# Patient Record
Sex: Male | Born: 1990 | Race: White | Hispanic: No | Marital: Married | Smoking: Current every day smoker
Health system: Southern US, Community
[De-identification: ages and names within clinical notes are randomized; demographics above are authoritative.]

## PROBLEM LIST (undated history)

## (undated) DIAGNOSIS — R569 Unspecified convulsions: Secondary | ICD-10-CM

---
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
## 2013-03-24 IMAGING — CR DG RIBS W/ CHEST 3+V*L*
4 series · 4 of 4 positions shown · non-contrast
Comparison: None.

CLINICAL DATA: Fell from second story.  Injured left ribs.

LEFT RIBS AND CHEST - 3+ VIEW 09/07/2010:

[view not recorded (1 of 4)]
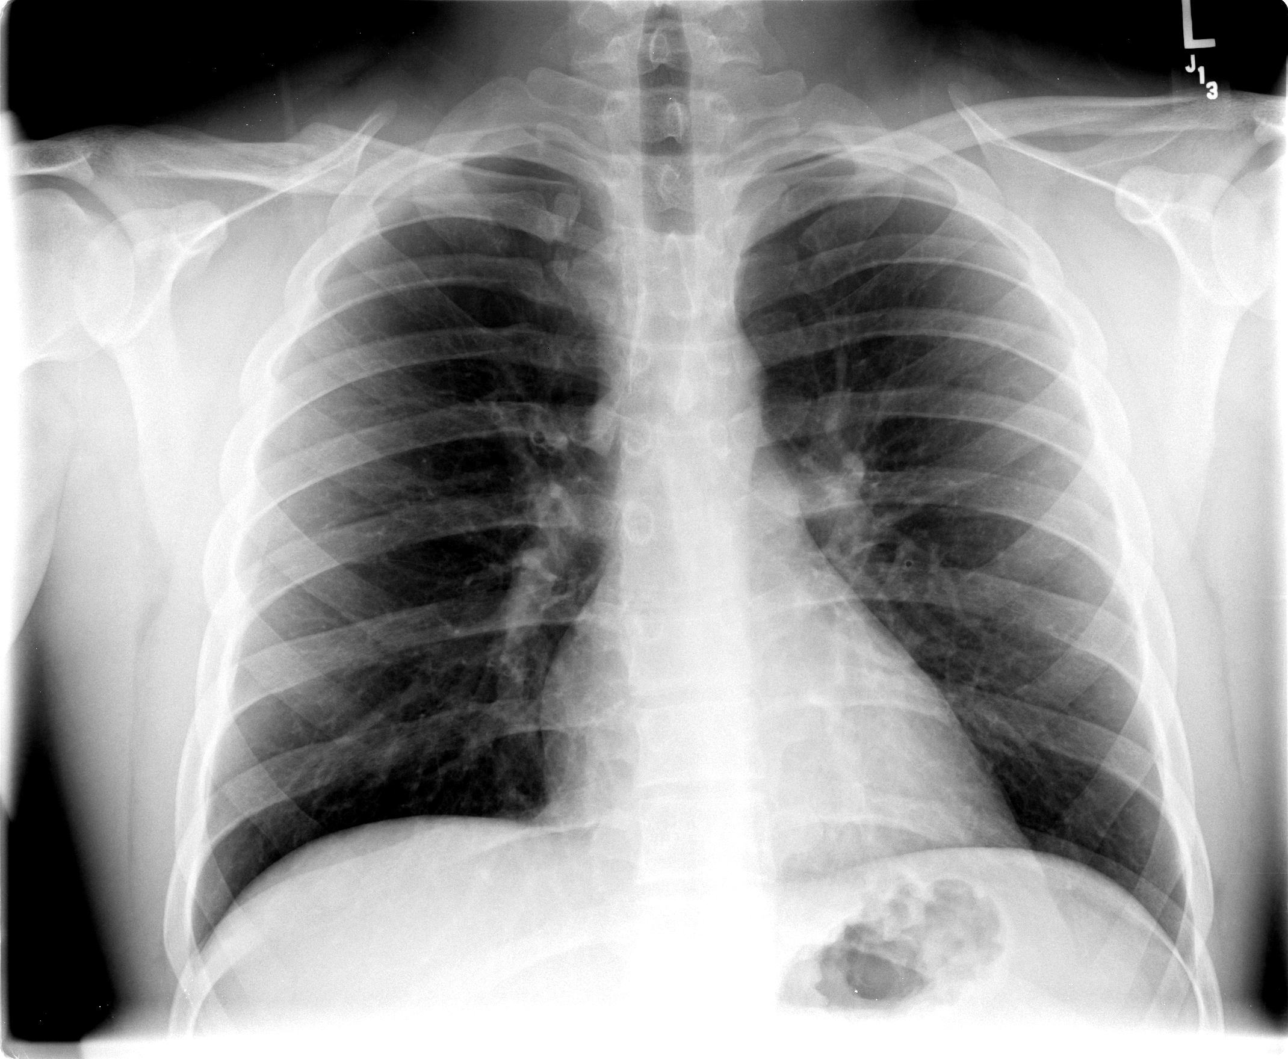

[view not recorded (2 of 4)]
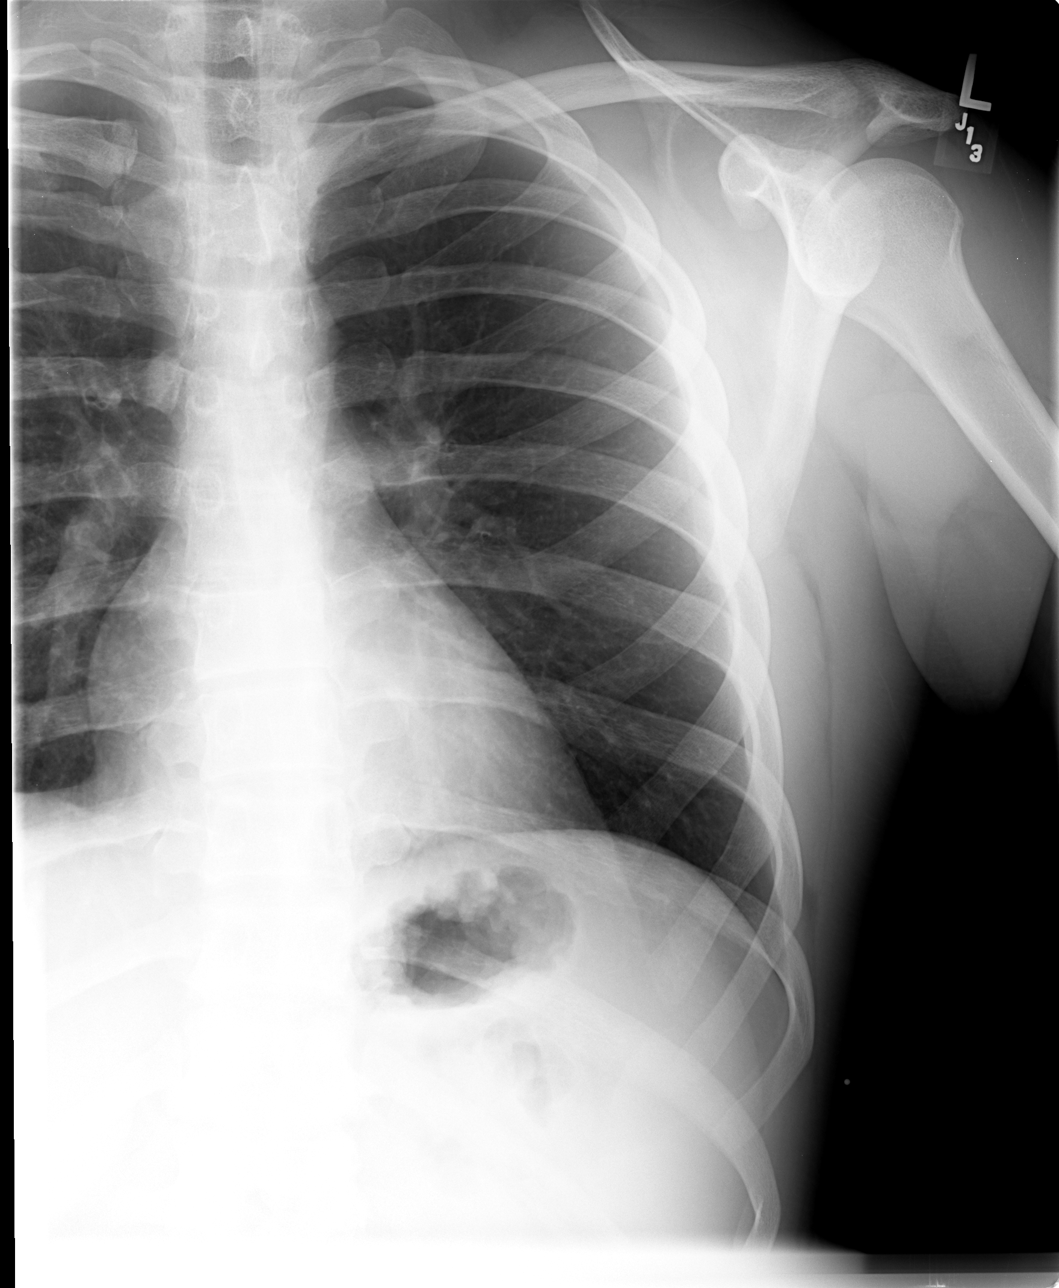

[view not recorded (3 of 4)]
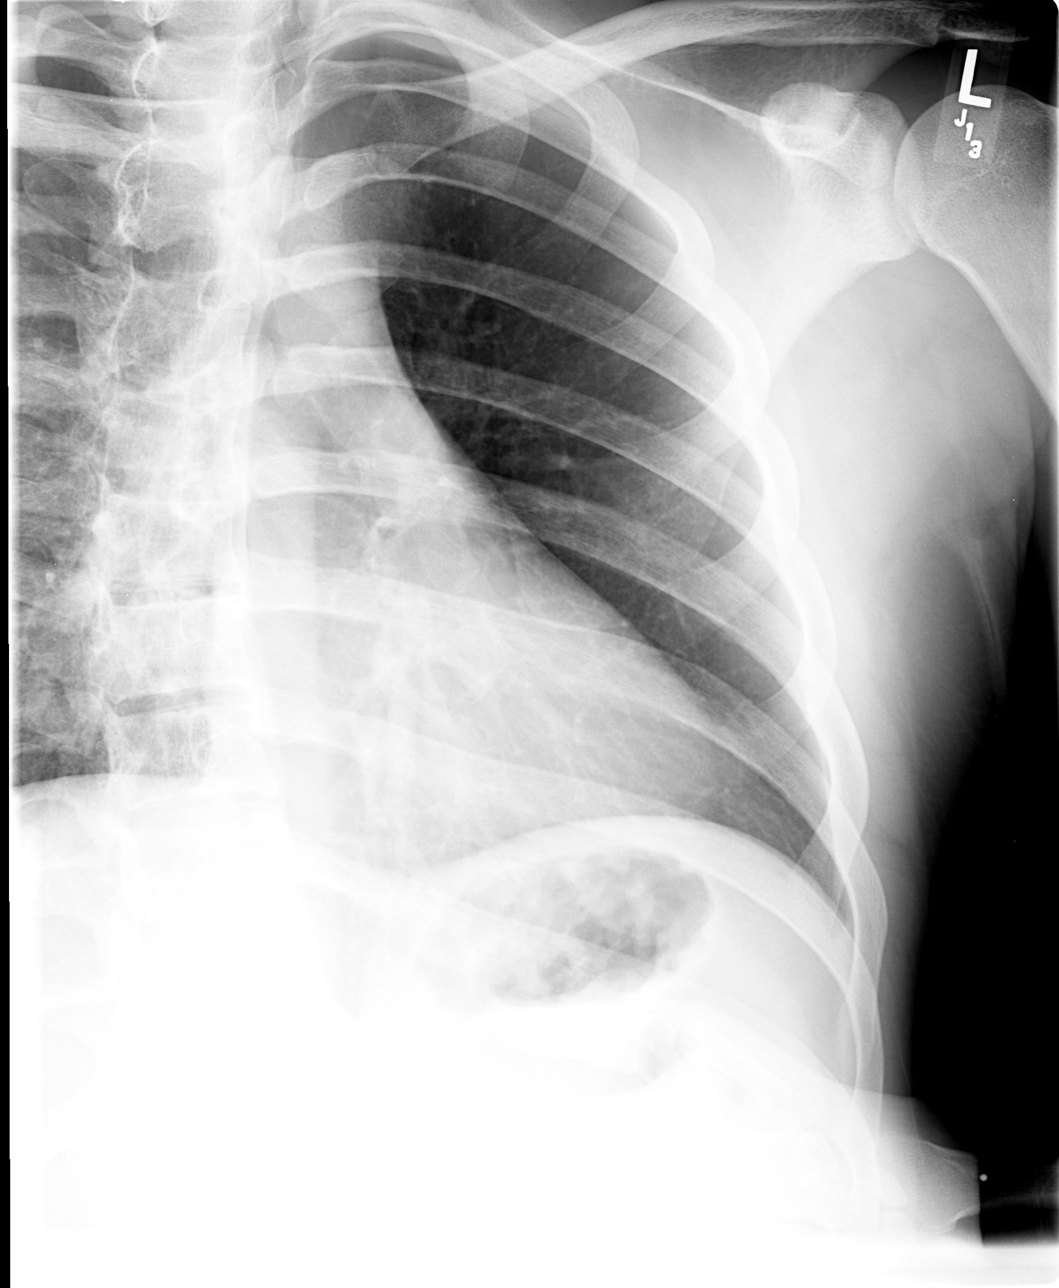

[view not recorded (4 of 4)]
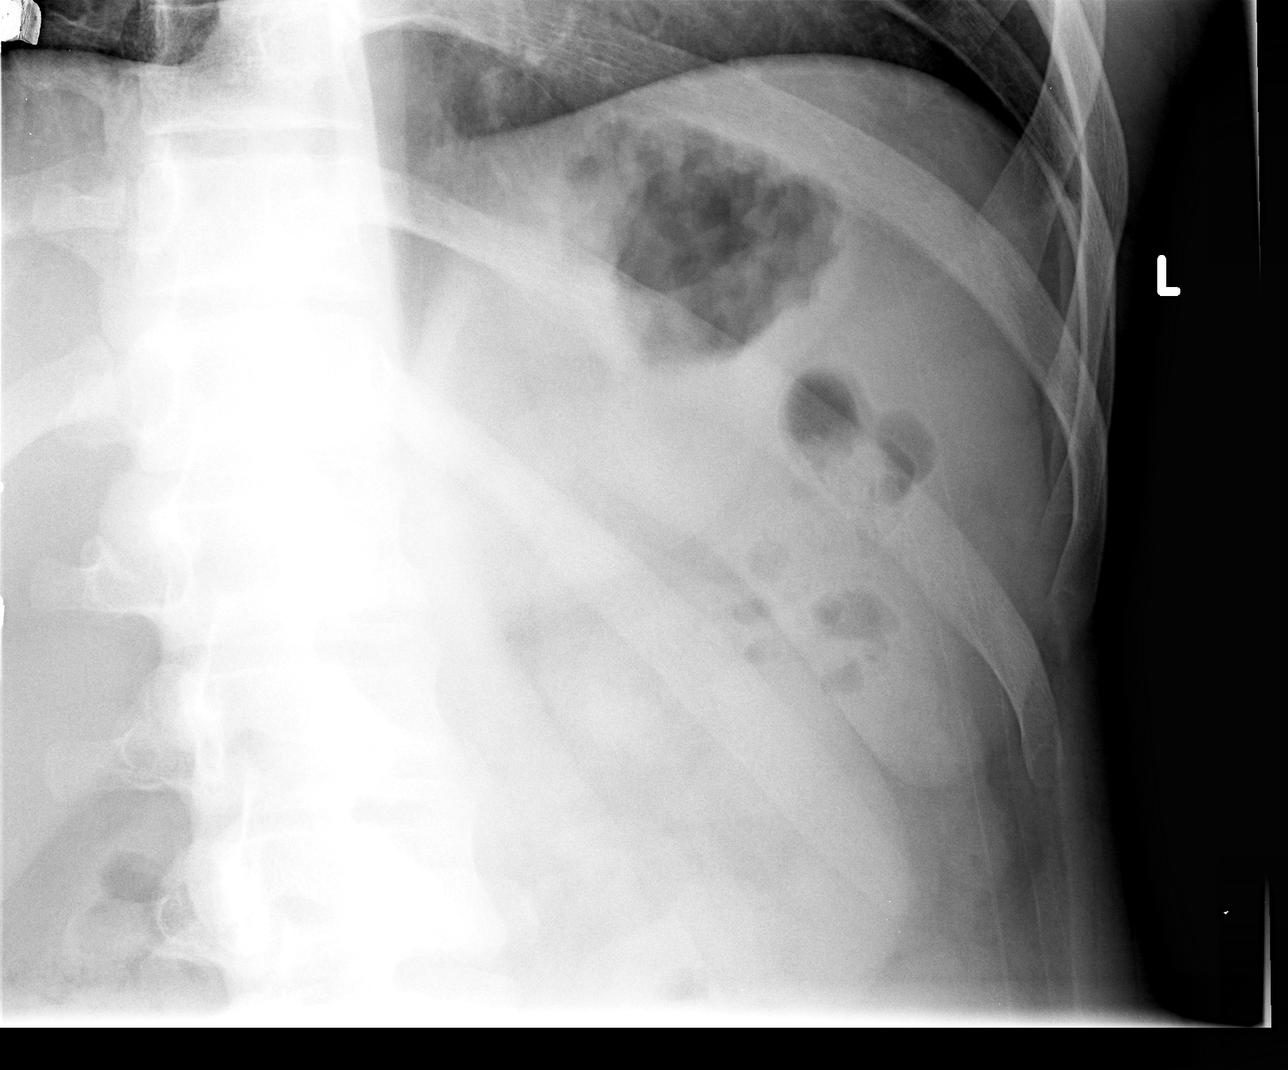

[4 of 4 positions shown; findings below may reference images not displayed]

FINDINGS: Site of maximal pain and tenderness marked with a small
metallic BB.  No underlying left rib fractures.  No intrinsic
osseous abnormalities involving the left ribs.

Cardiomediastinal silhouette unremarkable.  Lungs clear.  No
pleural effusions.  No pneumothorax.
IMPRESSION: 1.  No left rib fracture identified.
2.  No acute cardiopulmonary disease.

## 2014-10-11 ENCOUNTER — Emergency Department
Admission: EM | Admit: 2014-10-11 | Discharge: 2014-10-11 | Disposition: A | Discharge status: Home / Self Care | Payer: Self-pay | Attending: Emergency Medicine | Admitting: Emergency Medicine

## 2014-10-11 ENCOUNTER — Encounter: Payer: Self-pay | Admitting: *Deleted

## 2014-10-11 ENCOUNTER — Emergency Department: Payer: Self-pay

## 2014-10-11 DIAGNOSIS — Y998 Other external cause status: Secondary | ICD-10-CM | POA: Insufficient documentation

## 2014-10-11 DIAGNOSIS — Y9389 Activity, other specified: Secondary | ICD-10-CM | POA: Insufficient documentation

## 2014-10-11 DIAGNOSIS — Z880 Allergy status to penicillin: Secondary | ICD-10-CM | POA: Insufficient documentation

## 2014-10-11 DIAGNOSIS — S60142A Contusion of left ring finger with damage to nail, initial encounter: Secondary | ICD-10-CM | POA: Insufficient documentation

## 2014-10-11 DIAGNOSIS — Y9289 Other specified places as the place of occurrence of the external cause: Secondary | ICD-10-CM | POA: Insufficient documentation

## 2014-10-11 DIAGNOSIS — Z760 Encounter for issue of repeat prescription: Secondary | ICD-10-CM | POA: Insufficient documentation

## 2014-10-11 DIAGNOSIS — Z720 Tobacco use: Secondary | ICD-10-CM | POA: Insufficient documentation

## 2014-10-11 DIAGNOSIS — W230XXA Caught, crushed, jammed, or pinched between moving objects, initial encounter: Secondary | ICD-10-CM | POA: Insufficient documentation

## 2014-10-11 HISTORY — DX: Unspecified convulsions: R56.9

## 2014-10-11 MED ORDER — LIDOCAINE HCL (PF) 1 % IJ SOLN
INTRAMUSCULAR | Status: AC
  Filled 2014-10-11: qty 5

## 2014-10-11 MED ORDER — PHENYTOIN SODIUM EXTENDED 100 MG PO CAPS: 200.0000 mg | ORAL_CAPSULE | Freq: Two times a day (BID) | ORAL | Status: DC

## 2014-10-11 MED ORDER — OXYCODONE-ACETAMINOPHEN 5-325 MG PO TABS
1.0000 | ORAL_TABLET | Freq: Once | ORAL | Status: AC
  Administered 2014-10-11: 1 via ORAL

## 2014-10-11 MED ORDER — BACITRACIN-NEOMYCIN-POLYMYXIN OINTMENT TUBE: TOPICAL_OINTMENT | Freq: Once | CUTANEOUS | Status: DC

## 2014-10-11 MED ORDER — TRAMADOL HCL 50 MG PO TABS: 50.0000 mg | ORAL_TABLET | Freq: Four times a day (QID) | ORAL | Status: DC | PRN

## 2014-10-11 MED ORDER — BACITRACIN ZINC 500 UNIT/GM EX OINT
TOPICAL_OINTMENT | CUTANEOUS | Status: AC
  Filled 2014-10-11: qty 0.9

## 2014-10-11 MED ORDER — OXYCODONE-ACETAMINOPHEN 5-325 MG PO TABS
ORAL_TABLET | ORAL | Status: AC
  Filled 2014-10-11: qty 1

## 2014-10-11 NOTE — Discharge Instructions (Signed)
Contusion °A contusion is a deep bruise. Contusions happen when an injury causes bleeding under the skin. Signs of bruising include pain, puffiness (swelling), and discolored skin. The contusion may turn blue, purple, or yellow. °HOME CARE  °· Put ice on the injured area. °¨ Put ice in a plastic bag. °¨ Place a towel between your skin and the bag. °¨ Leave the ice on for 15-20 minutes, 03-04 times a day. °· Only take medicine as told by your doctor. °· Rest the injured area. °· If possible, raise (elevate) the injured area to lessen puffiness. °GET HELP RIGHT AWAY IF:  °· You have more bruising or puffiness. °· You have pain that is getting worse. °· Your puffiness or pain is not helped by medicine. °MAKE SURE YOU:  °· Understand these instructions. °· Will watch your condition. °· Will get help right away if you are not doing well or get worse. °Document Released: 09/12/2007 Document Revised: 06/18/2011 Document Reviewed: 01/29/2011 °ExitCare® Patient Information ©2015 [employer]. This information is not intended to replace advice given to you by your health care provider. Make sure you discuss any questions you have with your health care provider. ° °

## 2014-10-11 NOTE — ED Provider Notes (Signed)
[facility] Emergency Department Provider Note  ____________________________________________  Time seen: Approximately 4:48 PM  I have reviewed the triage vital signs and the nursing notes.   HISTORY  Chief Complaint Finger Injury    HPI Edward Harper is a 24 y.o. male patient complaining of pain to the fourth digit left hand connected to a crush incident. Patient stated to this finger was crushed between machinery. State he lost distal part of the nail. Denies any loss sensation or loss of motion. Patient rated his pain as a 7/10. Patient is also requesting refill of his Dilantin.Marland Kitchen.   Past Medical History  Diagnosis Date  . Seizures     There are no active problems to display for this patient.   History reviewed. No pertinent past surgical history.  Current Outpatient Rx  Name  Route  Sig  Dispense  Refill  . phenytoin (DILANTIN) 100 MG ER capsule   Oral   Take 200 mg by mouth 2 (two) times daily.         . phenytoin (DILANTIN) 100 MG ER capsule   Oral   Take 2 capsules (200 mg total) by mouth 2 (two) times daily.   60 capsule   0   . traMADol (ULTRAM) 50 MG tablet   Oral   Take 1 tablet (50 mg total) by mouth every 6 (six) hours as needed for moderate pain.   12 tablet   0     Allergies Penicillins  No family history on file.  Social History History  Substance Use Topics  . Smoking status: Current Every Day Smoker  . Smokeless tobacco: Not on file  . Alcohol Use: No    Review of Systems Constitutional: No fever/chills Eyes: No visual changes. ENT: No sore throat. Cardiovascular: Denies chest pain. Respiratory: Denies shortness of breath. Gastrointestinal: No abdominal pain.  No nausea, no vomiting.  No diarrhea.  No constipation. Genitourinary: Negative for dysuria. Musculoskeletal: Pain for digit left hand. Skin: Negative for rash. Neurological: Negative for headaches, focal weakness or numbness.  10-point ROS  otherwise negative.  ____________________________________________   PHYSICAL EXAM:  VITAL SIGNS: ED Triage Vitals  Enc Vitals Group     BP 10/11/14 1508 138/74 mmHg     Pulse Rate 10/11/14 1508 66     Resp --      Temp 10/11/14 1508 98.3 F (36.8 C)     Temp Source 10/11/14 1508 Oral     SpO2 10/11/14 1508 99 %     Weight 10/11/14 1508 175 lb (79.379 kg)     Height 10/11/14 1508 5\' 8"  (1.727 m)     Head Cir --      Peak Flow --      Pain Score --      Pain Loc --      Pain Edu? --      Excl. in GC? --     Constitutional: Alert and oriented. Well appearing and in no acute distress. Eyes: Conjunctivae are normal. PERRL. EOMI. Head: Atraumatic. Nose: No congestion/rhinnorhea. Mouth/Throat: Mucous membranes are moist.  Oropharynx non-erythematous. Neck: No stridor. No cervical spine tenderness to palpation. Hematological/Lymphatic/Immunilogical: No cervical lymphadenopathy. Cardiovascular: Normal rate, regular rhythm. Grossly normal heart sounds.  Good peripheral circulation. Respiratory: Normal respiratory effort.  No retractions. Lungs CTAB. Gastrointestinal: Soft and nontender. No distention. No abdominal bruits. No CVA tenderness. Musculoskeletal: No lower extremity tenderness nor edema.  No joint effusions. Neurologic:  Normal speech and language. No gross focal neurologic  deficits are appreciated. Speech is normal. No gait instability. Skin:  Skin is warm, dry and intact. No rash noted. Psychiatric: Mood and affect are normal. Speech and behavior are normal.  ____________________________________________   LABS (all labs ordered are listed, but only abnormal results are displayed)  Labs Reviewed - No data to display ____________________________________________  EKG   ____________________________________________  RADIOLOGY  No acute findings.  I, Joni Reining, personally viewed and evaluated these images as part of my medical decision making.    ____________________________________________   PROCEDURES  Procedure(s) performed: None  Critical Care performed: No  ____________________________________________   INITIAL IMPRESSION / ASSESSMENT AND PLAN / ED COURSE  Pertinent labs & imaging results that were available during my care of the patient were reviewed by me and considered in my medical decision making (see chart for details).  Partial nail avulsion to the fourth digit left hand secondary to a crush incident. Patient is also requesting refill for Dilantin. Discussed x-ray finding with patient showing no fracture. Patient given instruction on nail injury. Patient advises that one time refill of Dilantin T Konrad Saha refills from his family doctor. Patient also given a prescription for 3 days of tramadol to take as needed for pain. Patient advised to return to clinic if signs of infection develop. ____________________________________________   FINAL CLINICAL IMPRESSION(S) / ED DIAGNOSES  Final diagnoses:  Contusion of left ring finger with damage to nail, initial encounter  Encounter for medication refill      Joni Reining, PA-C 10/11/14 1814  Sharman Cheek, MD 10/11/14 2356

## 2014-10-11 NOTE — ED Notes (Signed)
Also need dilantin  refilled

## 2017-04-27 IMAGING — CR DG FINGER RING 2+V*L*
1 series · 3 of 3 positions shown · non-contrast
Comparison: None.

CLINICAL DATA: Crush injury to the ring finger today. Pain and
bleeding of the distal tuft.

EXAM:
LEFT RING FINGER 2+V

[Series 1: lat · 0.17mm/px · 3 of 3 slices shown]
[im 1/3]
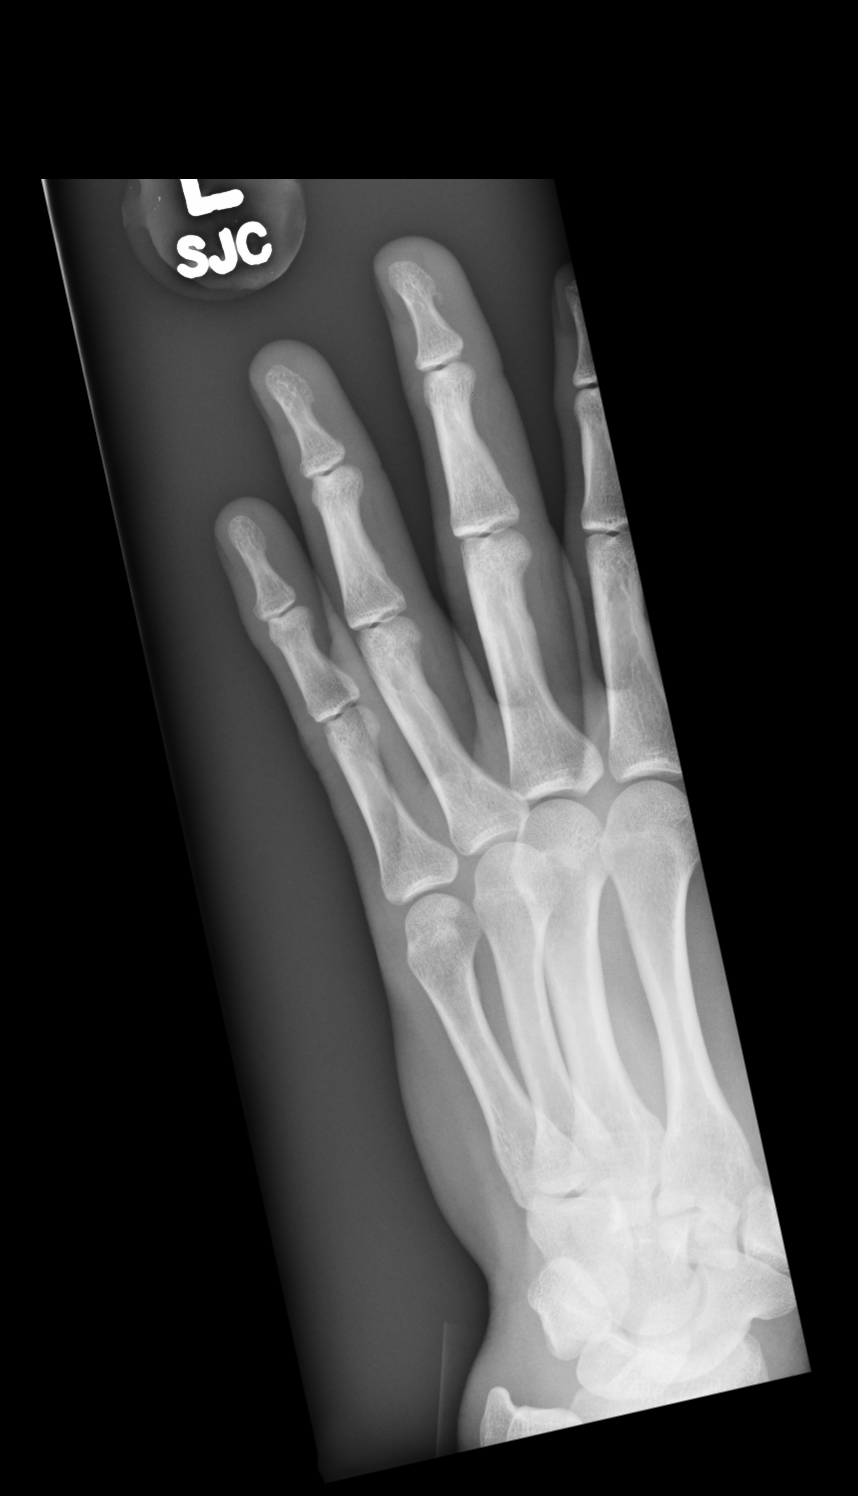
[im 2/3]
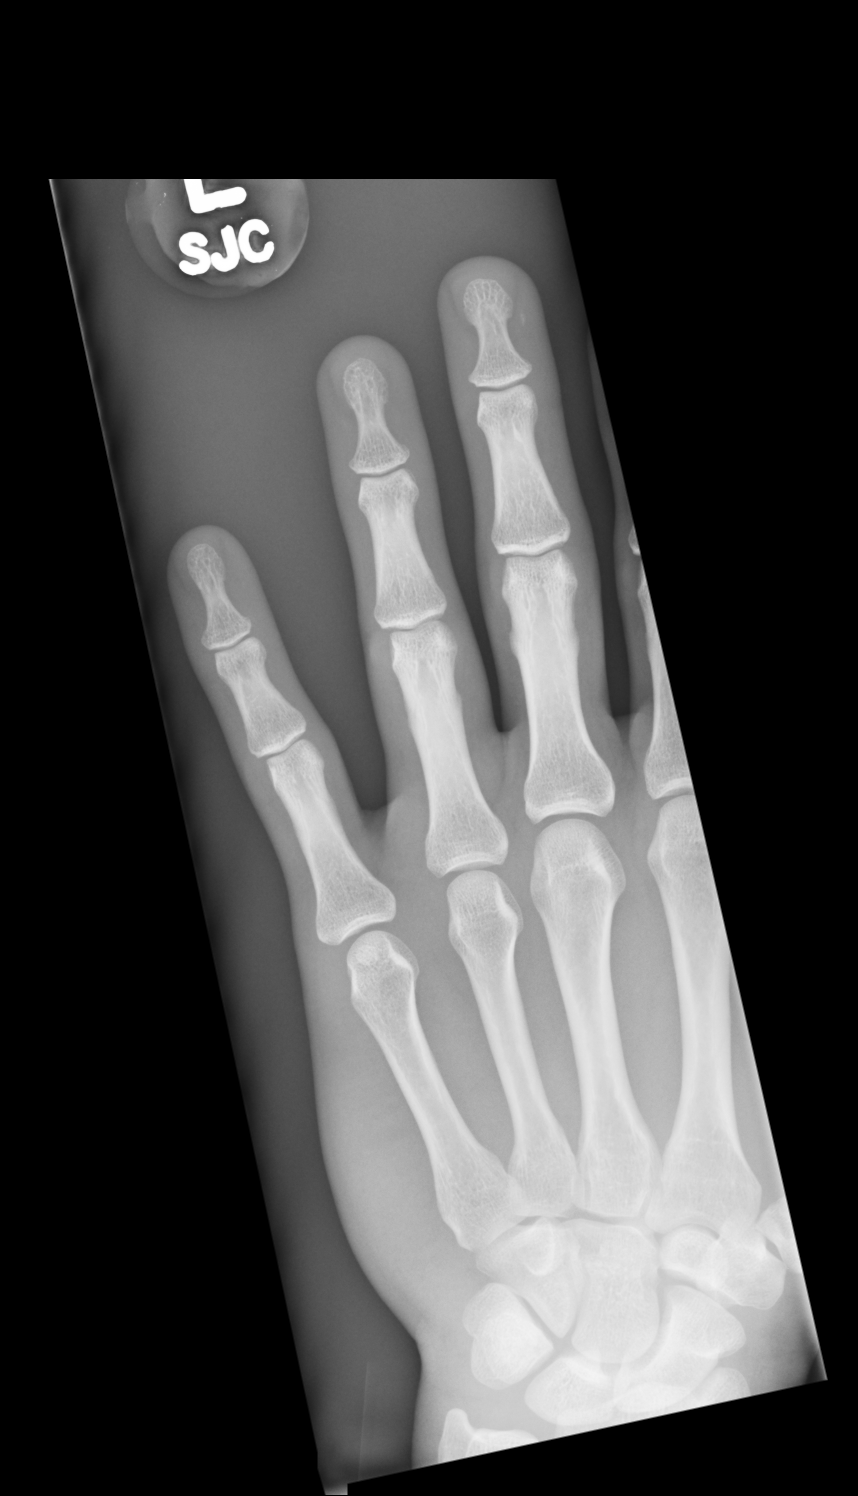
[im 3/3]
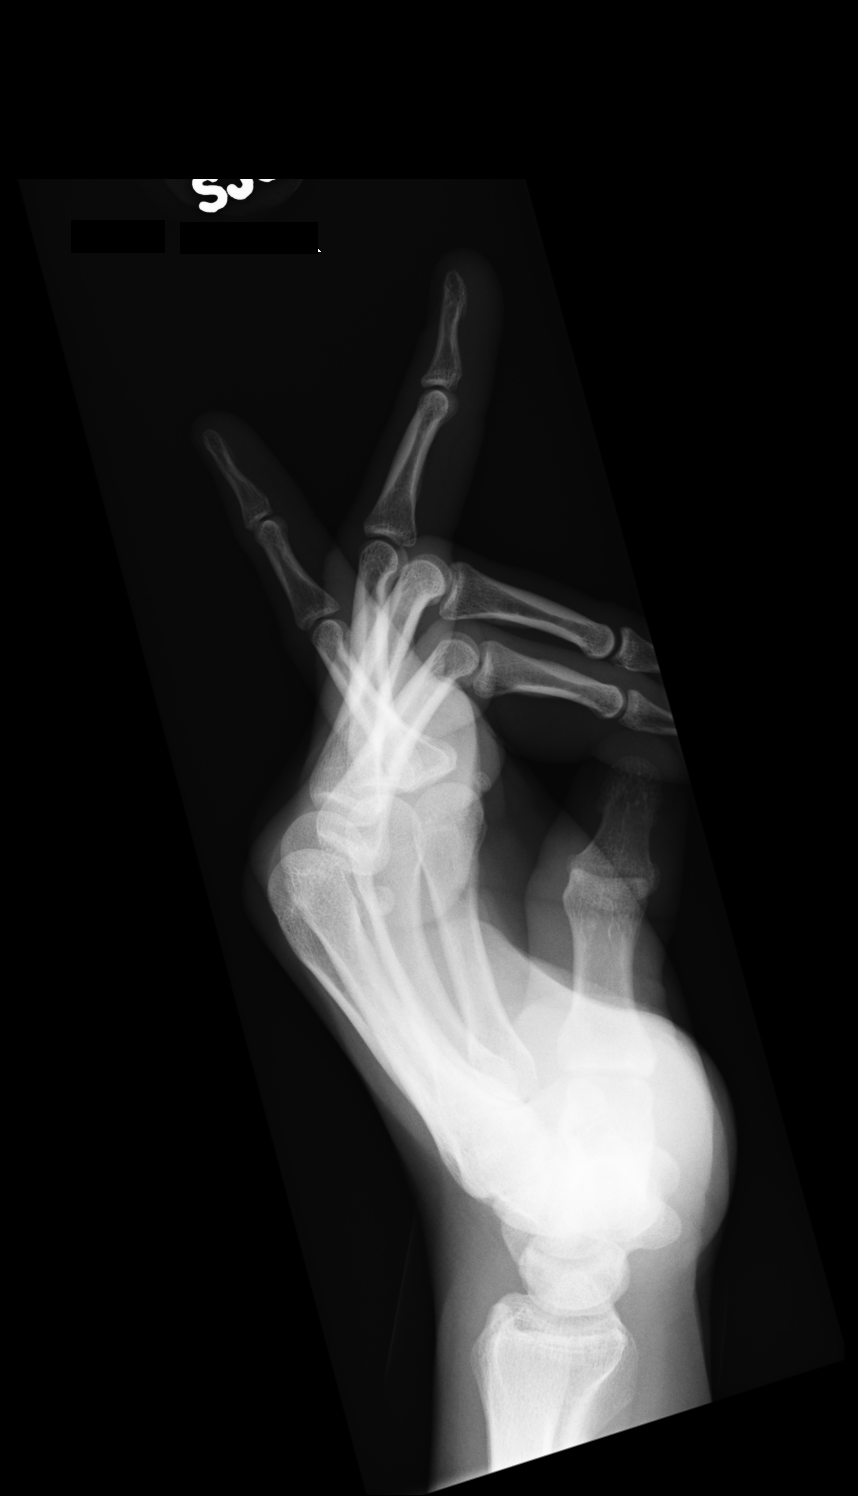

[3 of 3 positions shown; findings below may reference images not displayed]

FINDINGS: No fracture, dislocation or radiopaque foreign object. Small
calcification noted adjacent to the distal tuft of the long finger,
not injured today by history.
IMPRESSION: Normal appearance of the ring finger.

## 2023-02-19 ENCOUNTER — Emergency Department (HOSPITAL_COMMUNITY): Payer: Medicaid Other

## 2023-02-19 ENCOUNTER — Encounter (HOSPITAL_COMMUNITY): Payer: Self-pay

## 2023-02-19 ENCOUNTER — Observation Stay (HOSPITAL_COMMUNITY)
Admission: EM | Admit: 2023-02-19 | Discharge: 2023-02-20 | Disposition: A | Discharge status: Home / Self Care | Payer: Medicaid Other | Attending: General Surgery | Admitting: General Surgery

## 2023-02-19 ENCOUNTER — Other Ambulatory Visit: Payer: Self-pay

## 2023-02-19 DIAGNOSIS — W138XXA Fall from, out of or through other building or structure, initial encounter: Secondary | ICD-10-CM | POA: Diagnosis not present

## 2023-02-19 DIAGNOSIS — W19XXXA Unspecified fall, initial encounter: Principal | ICD-10-CM

## 2023-02-19 DIAGNOSIS — S0990XA Unspecified injury of head, initial encounter: Secondary | ICD-10-CM | POA: Insufficient documentation

## 2023-02-19 DIAGNOSIS — Z79899 Other long term (current) drug therapy: Secondary | ICD-10-CM | POA: Insufficient documentation

## 2023-02-19 DIAGNOSIS — T07XXXA Unspecified multiple injuries, initial encounter: Secondary | ICD-10-CM

## 2023-02-19 DIAGNOSIS — F17200 Nicotine dependence, unspecified, uncomplicated: Secondary | ICD-10-CM | POA: Diagnosis not present

## 2023-02-19 DIAGNOSIS — M25512 Pain in left shoulder: Secondary | ICD-10-CM | POA: Diagnosis present

## 2023-02-19 DIAGNOSIS — S301XXA Contusion of abdominal wall, initial encounter: Secondary | ICD-10-CM | POA: Insufficient documentation

## 2023-02-19 DIAGNOSIS — S27329A Contusion of lung, unspecified, initial encounter: Secondary | ICD-10-CM | POA: Diagnosis present

## 2023-02-19 DIAGNOSIS — S27322A Contusion of lung, bilateral, initial encounter: Secondary | ICD-10-CM | POA: Diagnosis not present

## 2023-02-19 LAB — URINALYSIS, ROUTINE W REFLEX MICROSCOPIC
Bacteria, UA: NONE SEEN
Bilirubin Urine: NEGATIVE
Glucose, UA: NEGATIVE mg/dL
Ketones, ur: NEGATIVE mg/dL
Leukocytes,Ua: NEGATIVE
Nitrite: NEGATIVE
Protein, ur: 100 mg/dL — AB
Specific Gravity, Urine: >1.046 — ABNORMAL HIGH (ref 1.005–1.030)
pH: 5 (ref 5.0–8.0)

## 2023-02-19 LAB — COMPREHENSIVE METABOLIC PANEL
ALT: 38 U/L (ref 0–44)
AST: 45 U/L — ABNORMAL HIGH (ref 15–41)
Albumin: 3.9 g/dL (ref 3.5–5.0)
Alkaline Phosphatase: 60 U/L (ref 38–126)
Anion gap: 9 (ref 5–15)
BUN: 19 mg/dL (ref 6–20)
CO2: 24 mmol/L (ref 22–32)
Calcium: 9.1 mg/dL (ref 8.9–10.3)
Chloride: 102 mmol/L (ref 98–111)
Creatinine, Ser: 1 mg/dL (ref 0.61–1.24)
GFR, Estimated: >60 mL/min (ref >60)
Glucose, Bld: 103 mg/dL — ABNORMAL HIGH (ref 70–99)
Potassium: 4 mmol/L (ref 3.5–5.1)
Sodium: 135 mmol/L (ref 135–145)
Total Bilirubin: 0.6 mg/dL (ref <1.2)
Total Protein: 6.3 g/dL — ABNORMAL LOW (ref 6.5–8.1)

## 2023-02-19 LAB — I-STAT CHEM 8, ED
BUN: 23 mg/dL — ABNORMAL HIGH (ref 6–20)
Calcium, Ion: 1.12 mmol/L — ABNORMAL LOW (ref 1.15–1.40)
Chloride: 102 mmol/L (ref 98–111)
Creatinine, Ser: 1.1 mg/dL (ref 0.61–1.24)
Glucose, Bld: 104 mg/dL — ABNORMAL HIGH (ref 70–99)
HCT: 41 % (ref 39.0–52.0)
Hemoglobin: 13.9 g/dL (ref 13.0–17.0)
Potassium: 3.8 mmol/L (ref 3.5–5.1)
Sodium: 138 mmol/L (ref 135–145)
TCO2: 25 mmol/L (ref 22–32)

## 2023-02-19 LAB — CBC
HCT: 39.6 % (ref 39.0–52.0)
Hemoglobin: 13.8 g/dL (ref 13.0–17.0)
MCH: 30 pg (ref 26.0–34.0)
MCHC: 34.8 g/dL (ref 30.0–36.0)
MCV: 86.1 fL (ref 80.0–100.0)
Platelets: 283 10*3/uL (ref 150–400)
RBC: 4.6 MIL/uL (ref 4.22–5.81)
RDW: 11.7 % (ref 11.5–15.5)
WBC: 17.3 10*3/uL — ABNORMAL HIGH (ref 4.0–10.5)
nRBC: 0 % (ref 0.0–0.2)

## 2023-02-19 LAB — I-STAT CG4 LACTIC ACID, ED: Lactic Acid, Venous: 1.7 mmol/L (ref 0.5–1.9)

## 2023-02-19 LAB — PROTIME-INR
INR: 1 (ref 0.8–1.2)
Prothrombin Time: 13.2 s (ref 11.4–15.2)

## 2023-02-19 LAB — SAMPLE TO BLOOD BANK

## 2023-02-19 LAB — ETHANOL: Alcohol, Ethyl (B): <10 mg/dL (ref <10)

## 2023-02-19 MED ORDER — FENTANYL CITRATE PF 50 MCG/ML IJ SOSY: 100.0000 ug | PREFILLED_SYRINGE | Freq: Once | INTRAMUSCULAR | Status: AC

## 2023-02-19 MED ORDER — OXYCODONE HCL 5 MG PO TABS
5.0000 mg | ORAL_TABLET | ORAL | Status: DC | PRN
Start: 2023-02-19 — End: 2023-02-20
  Administered 2023-02-20: 5 mg via ORAL
  Filled 2023-02-19: qty 1

## 2023-02-19 MED ORDER — ONDANSETRON HCL 4 MG/2ML IJ SOLN: 4.0000 mg | Freq: Four times a day (QID) | INTRAMUSCULAR | Status: DC | PRN

## 2023-02-19 MED ORDER — ENOXAPARIN SODIUM 30 MG/0.3ML IJ SOSY
30.0000 mg | PREFILLED_SYRINGE | Freq: Two times a day (BID) | INTRAMUSCULAR | Status: DC
  Filled 2023-02-19: qty 0.3

## 2023-02-19 MED ORDER — DOCUSATE SODIUM 100 MG PO CAPS
100.0000 mg | ORAL_CAPSULE | Freq: Two times a day (BID) | ORAL | Status: DC
  Administered 2023-02-19 – 2023-02-20 (×2): 100 mg via ORAL
  Filled 2023-02-19 (×2): qty 1

## 2023-02-19 MED ORDER — MELATONIN 3 MG PO TABS
3.0000 mg | ORAL_TABLET | Freq: Every evening | ORAL | Status: DC | PRN
  Administered 2023-02-20: 3 mg via ORAL
  Filled 2023-02-19: qty 1

## 2023-02-19 MED ORDER — METHOCARBAMOL 500 MG PO TABS
500.0000 mg | ORAL_TABLET | Freq: Three times a day (TID) | ORAL | Status: DC
  Administered 2023-02-19 – 2023-02-20 (×2): 500 mg via ORAL
  Filled 2023-02-19 (×2): qty 1

## 2023-02-19 MED ORDER — POLYETHYLENE GLYCOL 3350 17 G PO PACK: 17.0000 g | PACK | Freq: Every day | ORAL | Status: DC | PRN

## 2023-02-19 MED ORDER — LEVETIRACETAM IN NACL 1000 MG/100ML IV SOLN
1000.0000 mg | Freq: Once | INTRAVENOUS | Status: AC
  Administered 2023-02-19: 1000 mg via INTRAVENOUS
  Filled 2023-02-19: qty 100

## 2023-02-19 MED ORDER — ACETAMINOPHEN 500 MG PO TABS
1000.0000 mg | ORAL_TABLET | Freq: Four times a day (QID) | ORAL | Status: DC
  Administered 2023-02-20: 1000 mg via ORAL
  Filled 2023-02-19 (×2): qty 2

## 2023-02-19 MED ORDER — IOPAMIDOL (ISOVUE-370) INJECTION 76%
75.0000 mL | Freq: Once | INTRAVENOUS | Status: AC | PRN
  Administered 2023-02-19: 75 mL via INTRAVENOUS

## 2023-02-19 MED ORDER — HYDROMORPHONE HCL 1 MG/ML IJ SOLN
0.5000 mg | INTRAMUSCULAR | Status: DC | PRN
  Administered 2023-02-19 – 2023-02-20 (×4): 0.5 mg via INTRAVENOUS
  Filled 2023-02-19: qty 1
  Filled 2023-02-19 (×3): qty 0.5

## 2023-02-19 MED ORDER — HYDRALAZINE HCL 20 MG/ML IJ SOLN: 10.0000 mg | INTRAMUSCULAR | Status: DC | PRN

## 2023-02-19 MED ORDER — GABAPENTIN 300 MG PO CAPS
300.0000 mg | ORAL_CAPSULE | Freq: Three times a day (TID) | ORAL | Status: DC
  Administered 2023-02-19 – 2023-02-20 (×2): 300 mg via ORAL
  Filled 2023-02-19 (×2): qty 1

## 2023-02-19 MED ORDER — ONDANSETRON 4 MG PO TBDP: 4.0000 mg | ORAL_TABLET | Freq: Four times a day (QID) | ORAL | Status: DC | PRN

## 2023-02-19 MED ORDER — OXYCODONE HCL 5 MG PO TABS
5.0000 mg | ORAL_TABLET | Freq: Once | ORAL | Status: AC
  Administered 2023-02-19: 5 mg via ORAL
  Filled 2023-02-19: qty 1

## 2023-02-19 MED ORDER — METOPROLOL TARTRATE 5 MG/5ML IV SOLN: 5.0000 mg | Freq: Four times a day (QID) | INTRAVENOUS | Status: DC | PRN

## 2023-02-19 MED ORDER — FENTANYL CITRATE PF 50 MCG/ML IJ SOSY
PREFILLED_SYRINGE | INTRAMUSCULAR | Status: AC
  Administered 2023-02-19: 100 ug via INTRAVENOUS
  Filled 2023-02-19: qty 2

## 2023-02-19 MED ORDER — FENTANYL CITRATE PF 50 MCG/ML IJ SOSY
200.0000 ug | PREFILLED_SYRINGE | Freq: Once | INTRAMUSCULAR | Status: AC
  Administered 2023-02-19: 200 ug via INTRAVENOUS
  Filled 2023-02-19: qty 4

## 2023-02-19 MED ORDER — KETOROLAC TROMETHAMINE 15 MG/ML IJ SOLN
15.0000 mg | Freq: Four times a day (QID) | INTRAMUSCULAR | Status: DC | PRN
Start: 2023-02-19 — End: 2023-02-24

## 2023-02-19 MED ORDER — METHOCARBAMOL 1000 MG/10ML IJ SOLN
500.0000 mg | Freq: Three times a day (TID) | INTRAMUSCULAR | Status: DC
Start: 2023-02-19 — End: 2023-02-20
  Filled 2023-02-19: qty 10

## 2023-02-19 NOTE — H&P (Addendum)
Surgical Evaluation  Chief Complaint: Fall  HPI: 32 year old man with history of seizure disorder (has not taken Keppra in about 2 days), and substance abuse currently on Suboxone who was up in a stand earlier today when he felt like he might be having a seizure, tried to make his way down and ended up falling about 20 feet.  Unknown downtime after this, he woke up crawling around the field, was found by his family who then called EMS.  He was activated as a level 2 trauma after arrival.  He reports pain in the left shoulder and chest, bruising to the right flank.  He has undergone plain films of the chest and pelvis as well as CT scans of the head, C-spine, and chest abdomen pelvis.  This is notable for a right first costochondral junction fracture as well as likely evolving right pulmonary contusions, soft tissue edema along the left hip and left posterior back.  Per Dr. Andria Meuse, bedside ultrasound with concern for pulmonary edema.  Trauma service admission requested regarding pulmonary contusions.  Allergies  Allergen Reactions   Penicillins Rash    Past Medical History:  Diagnosis Date   Seizures (HCC)     History reviewed. No pertinent surgical history.  History reviewed. No pertinent family history.  Social History   Socioeconomic History   Marital status: Single    Spouse name: Not on file   Number of children: Not on file   Years of education: Not on file   Highest education level: Not on file  Occupational History   Not on file  Tobacco Use   Smoking status: Every Day   Smokeless tobacco: Not on file  Substance and Sexual Activity   Alcohol use: No   Drug use: Not on file   Sexual activity: Not Currently  Other Topics Concern   Not on file  Social History Narrative   Not on file   Social Determinants of Health   Financial Resource Strain: Not on file  Food Insecurity: Not on file  Transportation Needs: Not on file  Physical Activity: Not on file  Stress: Not  on file  Social Connections: Not on file    No current facility-administered medications on file prior to encounter.   Current Outpatient Medications on File Prior to Encounter  Medication Sig Dispense Refill   phenytoin (DILANTIN) 100 MG ER capsule Take 200 mg by mouth 2 (two) times daily.     phenytoin (DILANTIN) 100 MG ER capsule Take 2 capsules (200 mg total) by mouth 2 (two) times daily. 60 capsule 0   traMADol (ULTRAM) 50 MG tablet Take 1 tablet (50 mg total) by mouth every 6 (six) hours as needed for moderate pain. 12 tablet 0    Review of Systems: a complete, 10pt review of systems was completed with pertinent positives and negatives as documented in the HPI  Physical Exam: Vitals:   02/19/23 1944 02/19/23 1945  BP:  132/80  Pulse:  87  Resp:  14  Temp: 98.8 F (37.1 C) 98.8 F (37.1 C)  SpO2:  99%   Gen: A&Ox3, no distress  Eyes: lids and conjunctivae normal, no icterus. Pupils equally round and reactive to light.  Neck: supple without mass or thyromegaly.  Trachea midline, no crepitus or hematoma.  No C-spine tenderness. Chest: respiratory effort is normal.  Cardiovascular: RRR with palpable distal pulses, no pedal edema Gastrointestinal: soft, nondistended, nontender. No mass, hepatomegaly or splenomegaly. No hernia. Muscoloskeletal: no clubbing or cyanosis of the  fingers.  Strength is symmetrical throughout.  Range of motion of bilateral upper and lower extremities normal without pain, crepitation or contracture. Neuro: cranial nerves grossly intact.  Sensation intact to light touch diffusely. Psych: appropriate mood and affect, normal insight/judgment intact  Skin: warm and dry; bruising along the right flank      Latest Ref Rng & Units 02/19/2023    7:47 PM 09/07/2010   11:29 AM 10/06/2009    3:13 AM  CBC  WBC 4.0 - 10.5 K/uL  7.7    Hemoglobin 13.0 - 17.0 g/dL 16.1  09.6  04.5   Hematocrit 39.0 - 52.0 % 41.0  45.2  53.0   Platelets 150 - 400 K/uL  233          Latest Ref Rng & Units 02/19/2023    7:47 PM 02/19/2023    7:28 PM 09/07/2010   11:29 AM  CMP  Glucose 70 - 99 mg/dL 409  811  86   BUN 6 - 20 mg/dL 23  19  15    Creatinine 0.61 - 1.24 mg/dL 9.14  7.82  9.56   Sodium 135 - 145 mmol/L 138  135  136   Potassium 3.5 - 5.1 mmol/L 3.8  4.0  4.7   Chloride 98 - 111 mmol/L 102  102  103   CO2 22 - 32 mmol/L  24  28   Calcium 8.9 - 10.3 mg/dL  9.1  21.3   Total Protein 6.5 - 8.1 g/dL  6.3    Total Bilirubin <1.2 mg/dL  0.6    Alkaline Phos 38 - 126 U/L  60    AST 15 - 41 U/L  45    ALT 0 - 44 U/L  38      Lab Results  Component Value Date   INR 1.0 02/19/2023    Imaging: CT HEAD WO CONTRAST  Result Date: 02/19/2023 CLINICAL DATA:  Polytrauma, blunt; Head trauma, moderate-severe pt had a 90ft fall from a tree EXAM: CT HEAD WITHOUT CONTRAST CT CERVICAL SPINE WITHOUT CONTRAST TECHNIQUE: Multidetector CT imaging of the head and cervical spine was performed following the standard protocol without intravenous contrast. Multiplanar CT image reconstructions of the cervical spine were also generated. RADIATION DOSE REDUCTION: This exam was performed according to the departmental dose-optimization program which includes automated exposure control, adjustment of the mA and/or kV according to patient size and/or use of iterative reconstruction technique. COMPARISON:  None Available. FINDINGS: CT HEAD FINDINGS Brain: No evidence of large-territorial acute infarction. No parenchymal hemorrhage. No mass lesion. No extra-axial collection. No mass effect or midline shift. No hydrocephalus. Basilar cisterns are patent. Vascular: No hyperdense vessel. Skull: No acute fracture or focal lesion. Sinuses/Orbits: Paranasal sinuses and mastoid air cells are clear. The orbits are unremarkable. Other: None. CT CERVICAL SPINE FINDINGS Alignment: Normal. Skull base and vertebrae: No acute fracture. No aggressive appearing focal osseous lesion or focal pathologic  process. Soft tissues and spinal canal: No prevertebral fluid or swelling. No visible canal hematoma. Upper chest: Unremarkable. Other: None. IMPRESSION: 1. No acute intracranial abnormality. 2. No acute displaced fracture or traumatic listhesis of the cervical spine. Electronically Signed   By: Tish Frederickson M.D.   On: 02/19/2023 20:40   CT CERVICAL SPINE WO CONTRAST  Result Date: 02/19/2023 CLINICAL DATA:  Polytrauma, blunt; Head trauma, moderate-severe pt had a 40ft fall from a tree EXAM: CT HEAD WITHOUT CONTRAST CT CERVICAL SPINE WITHOUT CONTRAST TECHNIQUE: Multidetector CT imaging of the head and cervical  spine was performed following the standard protocol without intravenous contrast. Multiplanar CT image reconstructions of the cervical spine were also generated. RADIATION DOSE REDUCTION: This exam was performed according to the departmental dose-optimization program which includes automated exposure control, adjustment of the mA and/or kV according to patient size and/or use of iterative reconstruction technique. COMPARISON:  None Available. FINDINGS: CT HEAD FINDINGS Brain: No evidence of large-territorial acute infarction. No parenchymal hemorrhage. No mass lesion. No extra-axial collection. No mass effect or midline shift. No hydrocephalus. Basilar cisterns are patent. Vascular: No hyperdense vessel. Skull: No acute fracture or focal lesion. Sinuses/Orbits: Paranasal sinuses and mastoid air cells are clear. The orbits are unremarkable. Other: None. CT CERVICAL SPINE FINDINGS Alignment: Normal. Skull base and vertebrae: No acute fracture. No aggressive appearing focal osseous lesion or focal pathologic process. Soft tissues and spinal canal: No prevertebral fluid or swelling. No visible canal hematoma. Upper chest: Unremarkable. Other: None. IMPRESSION: 1. No acute intracranial abnormality. 2. No acute displaced fracture or traumatic listhesis of the cervical spine. Electronically Signed   By:  Tish Frederickson M.D.   On: 02/19/2023 20:40   DG Pelvis Portable  Result Date: 02/19/2023 CLINICAL DATA:  Trauma EXAM: PORTABLE PELVIS 1-2 VIEWS COMPARISON:  CT abdomen pelvis 02/19/2023 FINDINGS: There is no evidence of pelvic fracture or diastasis. No pelvic bone lesions are seen. IMPRESSION: Negative. Electronically Signed   By: Tish Frederickson M.D.   On: 02/19/2023 20:34   DG Chest Port 1 View  Result Date: 02/19/2023 CLINICAL DATA:  Trauma EXAM: PORTABLE CHEST 1 VIEW COMPARISON:  CT chest 02/19/2023 FINDINGS: The heart and mediastinal contours are within normal limits. Patchy airspace opacities better evaluated on CT. No pulmonary edema. No pleural effusion. No pneumothorax. No acute osseous abnormality. IMPRESSION: Patchy airspace opacities better evaluated on CT. Electronically Signed   By: Tish Frederickson M.D.   On: 02/19/2023 20:32   CT CHEST ABDOMEN PELVIS W CONTRAST  Result Date: 02/19/2023 CLINICAL DATA:  Polytrauma, blunt  a 72ft fall from a tree EXAM: CT CHEST, ABDOMEN, AND PELVIS WITH CONTRAST TECHNIQUE: Multidetector CT imaging of the chest, abdomen and pelvis was performed following the standard protocol during bolus administration of intravenous contrast. RADIATION DOSE REDUCTION: This exam was performed according to the departmental dose-optimization program which includes automated exposure control, adjustment of the mA and/or kV according to patient size and/or use of iterative reconstruction technique. CONTRAST:  75mL ISOVUE-370 IOPAMIDOL (ISOVUE-370) INJECTION 76% COMPARISON:  None Available. FINDINGS: CHEST: Cardiovascular: No aortic injury. The thoracic aorta is normal in caliber. The heart is normal in size. No significant pericardial effusion. Mediastinum/Nodes: Likely trace residual thymus within the anterior mediastinum. No pneumomediastinum. No mediastinal hematoma. The esophagus is unremarkable. The thyroid is unremarkable. The central airways are patent. No mediastinal,  hilar, or axillary lymphadenopathy. Lungs/Pleura: Peripheral ground-glass airspace opacities within the right lower, bilateral upper lobe suggestive of developing pulmonary contusions. No pulmonary nodule. No pulmonary mass. No pulmonary laceration. No pneumatocele formation. No pleural effusion. No pneumothorax. No hemothorax. Musculoskeletal/Chest wall: No chest wall mass. Left posterior back subcutaneus soft tissue edema versus developing hematoma. No acute sternal fracture. Question anterior right first costochondral junction fracture (6:63). No spinal fracture. 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 ABDOMEN / PELVIS: Hepatobiliary: Not enlarged. No focal lesion. No laceration or subcapsular hematoma. The gallbladder is otherwise unremarkable with no radio-opaque gallstones. No biliary ductal dilatation. Pancreas: Normal pancreatic contour. No main pancreatic duct dilatation. Spleen: Not enlarged. No focal lesion. No laceration, subcapsular hematoma, or  vascular injury. Adrenals/Urinary Tract: No nodularity bilaterally. Bilateral kidneys enhance symmetrically. No hydronephrosis. No contusion, laceration, or subcapsular hematoma. No injury to the vascular structures or collecting systems. No hydroureter. The urinary bladder is unremarkable. On delayed imaging, there is no urothelial wall thickening and there are no filling defects in the opacified portions of the bilateral collecting systems or ureters. Stomach/Bowel: No small or large bowel wall thickening or dilatation. The appendix is unremarkable. Vasculature/Lymphatics: No abdominal aorta or iliac aneurysm. No active contrast extravasation or pseudoaneurysm. No abdominal, pelvic, inguinal lymphadenopathy. Reproductive: Normal. Other: No simple free fluid ascites. No pneumoperitoneum. No hemoperitoneum. No mesenteric hematoma identified. No organized fluid collection. Musculoskeletal: Left hip subcutaneus soft tissue hematoma along the fascia lata with no  definite Norma Fredrickson lesion identified. No acute pelvic fracture. No spinal fracture. Ports and Devices: None. IMPRESSION: 1. Question anterior right first costochondral junction fracture. Correlate with point tenderness to palpation. 2. Peripheral ground-glass airspace opacities within the right lower, bilateral upper lobe suggestive of developing pulmonary contusions. 3. Left hip subcutaneus soft tissue edema. No definite Norma Fredrickson lesion . Recommend attention on follow-up. No underlying pelvic fracture. 4. Left posterior back subcutaneus soft tissue edema versus developing hematoma. 5. No acute intra-abdominal or intrapelvic traumatic injury. 6. No acute fracture or traumatic malalignment of the thoracic or lumbar spine. Electronically Signed   By: Tish Frederickson M.D.   On: 02/19/2023 20:32     A/P: 32 year old male with seizure disorder who presents with possible right 1st rib costochondral junction fracture and pulmonary contusions after a 20 foot fall and seizure. C/o left hip pain- no significant injury on CT- and left shoulder pain- plain films final read pending but no obvious injury.   Will admit to the trauma service for pulmonary toilet, pain control, repeat chest x-ray in the morning.  Seizure disorder- resume home meds once verified by pharmacy  Suboxone - verify, somewhat hyperalgesic; continue PRN pain control for now    Phylliss Blakes, MD [facility]  See AMION to contact appropriate on-call provider

## 2023-02-19 NOTE — ED Triage Notes (Signed)
Pt BIB [facility] pt had a 29ft fall from a tree after having an episode of seizure, unknown seizure time. Pt called wife and family brought pt to EMS. Per EMS, pt has a left shoulder deformity, bruising to his right side.  Gave 200 mcg of Fentanyl en route last dose of 50 mcg given at 1915.

## 2023-02-19 NOTE — TOC CAGE-AID Note (Signed)
Transition of Care [facility]) - CAGE-AID Screening   Patient Details  Name: Edward Harper MRN: [id number] Date of Birth: February 21, 1991  Transition of Care (TOC) CM/SW Contact:    Katha Hamming, RN Phone Number: 02/19/2023, 8:45 PM     CAGE-AID Screening:    Have You Ever Felt You Ought to Cut Down on Your Drinking or Drug Use?: No Have People Annoyed You By Critizing Your Drinking Or Drug Use?: No Have You Felt Bad Or Guilty About Your Drinking Or Drug Use?: No Have You Ever Had a Drink or Used Drugs First Thing In The Morning to Steady Your Nerves or to Get Rid of a Hangover?: No CAGE-AID Score: 0  Substance Abuse Education Offered: No      On suboxone, denies drug/alcohol use

## 2023-02-19 NOTE — ED Notes (Addendum)
Trauma Response Nurse Documentation   Edward Harper is a 32 y.o. male arriving to [facility] ED via EMS  On No antithrombotic. Trauma was activated as a level 2 by ED charge RN based on the following trauma criteria Falls > 20 ft. with adults, >10 ft. with children (<15).  Patient cleared for CT by Dr. Andria Meuse EDP. Pt transported to CT with trauma response nurse present to monitor. RN remained with the patient throughout their absence from the department for clinical observation.   GCS 15.  History   Past Medical History:  Diagnosis Date   Seizures (HCC)      History reviewed. No pertinent surgical history.  Substance abuse - on suboxone currently   Initial Focused Assessment (If applicable, or please see trauma documentation): Alert/oriented male presents via EMS after a fall from tree stand approx 20 feet. Had a seizure and woke up crawling around in a field. Left shoulder pain/chest pain, bruising to right flank.  Airway patent/unobstructed, BS clear No obvious uncontrolled bleeding GCS 15 PERRLA 2  CT's Completed:   CT Head, CT C-Spine, CT Chest w/ contrast, and CT abdomen/pelvis w/ contrast   Interventions:  IV start and trauma lab draw  Portable chest and pelvis XRAY CT head, c-spine, chest/abd/pelvis Keppra IV bolus Fentanyl for pain control Family presence CAGE AID  Plan for disposition:  Admit anticipated  Consults completed:  Trauma Connor paged at 2044, responded at 2048  Event Summary: Presents via EMS for approx 20 foot fall. Reports he had a seizure while trying to climb down from tree stand. Woke up in a field with no recollection of injury. Non compliance with seizure meds, hasn't had them in two days. Left shoulder/chest pain, fright flank bruising. Anticipate admit.  MTP Summary (If applicable): NA  Bedside handoff with ED RN Edward Harper.    Edward Harper O Edward Harper  Trauma Response RN  Please call TRN at [phone] for further assistance.

## 2023-02-19 NOTE — ED Provider Notes (Signed)
[place] EMERGENCY DEPARTMENT AT [facility] Provider Note  CSN: [id number] Arrival date & time: 02/19/23 7846  Chief Complaint(s) Fall  HPI Edward Harper is a 32 y.o. male here today after he fell out of a deer stand approximately 20 feet.  Patient has a seizure disorder, says that he has not taken his Keppra in the last 2 days.  He says that he felt like he was beginning to have a seizure, and tried to get down from the stand, and woke up on the ground.  His family found him.  Patient was not initially activated as a trauma.   Past Medical History Past Medical History:  Diagnosis Date   Seizures [facility])    Patient Active Problem List   Diagnosis Date Noted   Pulmonary contusion 02/19/2023   Home Medication(s) Prior to Admission medications   Medication Sig Start Date End Date Taking? Authorizing Provider  levETIRAcetam (KEPPRA) 1000 MG tablet Take 1,000 mg by mouth 2 (two) times daily. 01/07/23  Yes [provider]  SUBOXONE 8-2 MG FILM Place 1 strip under the tongue 3 (three) times daily. 02/05/23  Yes [provider]                                                                                                                                    Past Surgical History History reviewed. No pertinent surgical history. Family History History reviewed. No pertinent family history.  Social History Social History   Tobacco Use   Smoking status: Every Day  Substance Use Topics   Alcohol use: No   Allergies Penicillins  Review of Systems Review of Systems  Physical Exam Vital Signs  I have reviewed the triage vital signs BP 132/80   Pulse 87   Temp 98.8 F (37.1 C)   Resp 14   Ht 5\' 8"  (1.727 m)   Wt 99.8 kg   SpO2 99%   BMI 33.45 kg/m   Physical Exam Vitals reviewed.  Constitutional:      Appearance: He is ill-appearing.  HENT:     Head: Normocephalic and atraumatic.     Right Ear: Tympanic membrane normal.     Left Ear:  Tympanic membrane normal.  Eyes:     Pupils: Pupils are equal, round, and reactive to light.  Neck:     Comments: Cervical collar in place Cardiovascular:     Rate and Rhythm: Normal rate.     Pulses: Normal pulses.  Pulmonary:     Comments: Left-sided rales.  Bilateral lung sounds.  Lung sliding bilaterally. Abdominal:     General: Abdomen is flat.     Palpations: Abdomen is soft.  Musculoskeletal:        General: Normal range of motion.     Comments: Pelvis stable  Skin:    General: Skin is warm and dry.     Comments: Bruising on the right flank  Neurological:     Mental Status: He is alert.     Comments: 5/5 grip strength.  5/5 lower extremity strength.  No loss of sensation.     ED Results and Treatments Labs (all labs ordered are listed, but only abnormal results are displayed) Labs Reviewed  COMPREHENSIVE METABOLIC PANEL - Abnormal; Notable for the following components:      Result Value   Glucose, Bld 103 (*)    Total Protein 6.3 (*)    AST 45 (*)    All other components within normal limits  URINALYSIS, ROUTINE W REFLEX MICROSCOPIC - Abnormal; Notable for the following components:   Specific Gravity, Urine >1.046 (*)    Hgb urine dipstick SMALL (*)    Protein, ur 100 (*)    All other components within normal limits  CBC - Abnormal; Notable for the following components:   WBC 17.3 (*)    All other components within normal limits  I-STAT CHEM 8, ED - Abnormal; Notable for the following components:   BUN 23 (*)    Glucose, Bld 104 (*)    Calcium, Ion 1.12 (*)    All other components within normal limits  ETHANOL  PROTIME-INR  HIV ANTIBODY (ROUTINE TESTING W REFLEX)  CBC  BASIC METABOLIC PANEL  I-STAT CG4 LACTIC ACID, ED  SAMPLE TO BLOOD BANK                                                                                                                          Radiology CT HEAD WO CONTRAST  Result Date: 02/19/2023 CLINICAL DATA:  Polytrauma, blunt;  Head trauma, moderate-severe pt had a 62ft fall from a tree EXAM: CT HEAD WITHOUT CONTRAST CT CERVICAL SPINE WITHOUT CONTRAST TECHNIQUE: Multidetector CT imaging of the head and cervical spine was performed following the standard protocol without intravenous contrast. Multiplanar CT image reconstructions of the cervical spine were also generated. RADIATION DOSE REDUCTION: This exam was performed according to the departmental dose-optimization program which includes automated exposure control, adjustment of the mA and/or kV according to patient size and/or use of iterative reconstruction technique. COMPARISON:  None Available. FINDINGS: CT HEAD FINDINGS Brain: No evidence of large-territorial acute infarction. No parenchymal hemorrhage. No mass lesion. No extra-axial collection. No mass effect or midline shift. No hydrocephalus. Basilar cisterns are patent. Vascular: No hyperdense vessel. Skull: No acute fracture or focal lesion. Sinuses/Orbits: Paranasal sinuses and mastoid air cells are clear. The orbits are unremarkable. Other: None. CT CERVICAL SPINE FINDINGS Alignment: Normal. Skull base and vertebrae: No acute fracture. No aggressive appearing focal osseous lesion or focal pathologic process. Soft tissues and spinal canal: No prevertebral fluid or swelling. No visible canal hematoma. Upper chest: Unremarkable. Other: None. IMPRESSION: 1. No acute intracranial abnormality. 2. No acute displaced fracture or traumatic listhesis of the cervical spine. Electronically Signed   By: Tish Frederickson M.D.   On: 02/19/2023 20:40   CT CERVICAL SPINE WO CONTRAST  Result Date: 02/19/2023 CLINICAL  DATA:  Polytrauma, blunt; Head trauma, moderate-severe pt had a 74ft fall from a tree EXAM: CT HEAD WITHOUT CONTRAST CT CERVICAL SPINE WITHOUT CONTRAST TECHNIQUE: Multidetector CT imaging of the head and cervical spine was performed following the standard protocol without intravenous contrast. Multiplanar CT image  reconstructions of the cervical spine were also generated. RADIATION DOSE REDUCTION: This exam was performed according to the departmental dose-optimization program which includes automated exposure control, adjustment of the mA and/or kV according to patient size and/or use of iterative reconstruction technique. COMPARISON:  None Available. FINDINGS: CT HEAD FINDINGS Brain: No evidence of large-territorial acute infarction. No parenchymal hemorrhage. No mass lesion. No extra-axial collection. No mass effect or midline shift. No hydrocephalus. Basilar cisterns are patent. Vascular: No hyperdense vessel. Skull: No acute fracture or focal lesion. Sinuses/Orbits: Paranasal sinuses and mastoid air cells are clear. The orbits are unremarkable. Other: None. CT CERVICAL SPINE FINDINGS Alignment: Normal. Skull base and vertebrae: No acute fracture. No aggressive appearing focal osseous lesion or focal pathologic process. Soft tissues and spinal canal: No prevertebral fluid or swelling. No visible canal hematoma. Upper chest: Unremarkable. Other: None. IMPRESSION: 1. No acute intracranial abnormality. 2. No acute displaced fracture or traumatic listhesis of the cervical spine. Electronically Signed   By: Tish Frederickson M.D.   On: 02/19/2023 20:40   DG Pelvis Portable  Result Date: 02/19/2023 CLINICAL DATA:  Trauma EXAM: PORTABLE PELVIS 1-2 VIEWS COMPARISON:  CT abdomen pelvis 02/19/2023 FINDINGS: There is no evidence of pelvic fracture or diastasis. No pelvic bone lesions are seen. IMPRESSION: Negative. Electronically Signed   By: Tish Frederickson M.D.   On: 02/19/2023 20:34   DG Chest Port 1 View  Result Date: 02/19/2023 CLINICAL DATA:  Trauma EXAM: PORTABLE CHEST 1 VIEW COMPARISON:  CT chest 02/19/2023 FINDINGS: The heart and mediastinal contours are within normal limits. Patchy airspace opacities better evaluated on CT. No pulmonary edema. No pleural effusion. No pneumothorax. No acute osseous abnormality.  IMPRESSION: Patchy airspace opacities better evaluated on CT. Electronically Signed   By: Tish Frederickson M.D.   On: 02/19/2023 20:32   CT CHEST ABDOMEN PELVIS W CONTRAST  Result Date: 02/19/2023 CLINICAL DATA:  Polytrauma, blunt  a 31ft fall from a tree EXAM: CT CHEST, ABDOMEN, AND PELVIS WITH CONTRAST TECHNIQUE: Multidetector CT imaging of the chest, abdomen and pelvis was performed following the standard protocol during bolus administration of intravenous contrast. RADIATION DOSE REDUCTION: This exam was performed according to the departmental dose-optimization program which includes automated exposure control, adjustment of the mA and/or kV according to patient size and/or use of iterative reconstruction technique. CONTRAST:  75mL ISOVUE-370 IOPAMIDOL (ISOVUE-370) INJECTION 76% COMPARISON:  None Available. FINDINGS: CHEST: Cardiovascular: No aortic injury. The thoracic aorta is normal in caliber. The heart is normal in size. No significant pericardial effusion. Mediastinum/Nodes: Likely trace residual thymus within the anterior mediastinum. No pneumomediastinum. No mediastinal hematoma. The esophagus is unremarkable. The thyroid is unremarkable. The central airways are patent. No mediastinal, hilar, or axillary lymphadenopathy. Lungs/Pleura: Peripheral ground-glass airspace opacities within the right lower, bilateral upper lobe suggestive of developing pulmonary contusions. No pulmonary nodule. No pulmonary mass. No pulmonary laceration. No pneumatocele formation. No pleural effusion. No pneumothorax. No hemothorax. Musculoskeletal/Chest wall: No chest wall mass. Left posterior back subcutaneus soft tissue edema versus developing hematoma. No acute sternal fracture. Question anterior right first costochondral junction fracture (6:63). No spinal fracture. 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 ABDOMEN / PELVIS: Hepatobiliary: Not enlarged. No focal lesion. No laceration or subcapsular  hematoma. The  gallbladder is otherwise unremarkable with no radio-opaque gallstones. No biliary ductal dilatation. Pancreas: Normal pancreatic contour. No main pancreatic duct dilatation. Spleen: Not enlarged. No focal lesion. No laceration, subcapsular hematoma, or vascular injury. Adrenals/Urinary Tract: No nodularity bilaterally. Bilateral kidneys enhance symmetrically. No hydronephrosis. No contusion, laceration, or subcapsular hematoma. No injury to the vascular structures or collecting systems. No hydroureter. The urinary bladder is unremarkable. On delayed imaging, there is no urothelial wall thickening and there are no filling defects in the opacified portions of the bilateral collecting systems or ureters. Stomach/Bowel: No small or large bowel wall thickening or dilatation. The appendix is unremarkable. Vasculature/Lymphatics: No abdominal aorta or iliac aneurysm. No active contrast extravasation or pseudoaneurysm. No abdominal, pelvic, inguinal lymphadenopathy. Reproductive: Normal. Other: No simple free fluid ascites. No pneumoperitoneum. No hemoperitoneum. No mesenteric hematoma identified. No organized fluid collection. Musculoskeletal: Left hip subcutaneus soft tissue hematoma along the fascia lata with no definite Norma Fredrickson lesion identified. No acute pelvic fracture. No spinal fracture. Ports and Devices: None. IMPRESSION: 1. Question anterior right first costochondral junction fracture. Correlate with point tenderness to palpation. 2. Peripheral ground-glass airspace opacities within the right lower, bilateral upper lobe suggestive of developing pulmonary contusions. 3. Left hip subcutaneus soft tissue edema. No definite Norma Fredrickson lesion . Recommend attention on follow-up. No underlying pelvic fracture. 4. Left posterior back subcutaneus soft tissue edema versus developing hematoma. 5. No acute intra-abdominal or intrapelvic traumatic injury. 6. No acute fracture or traumatic malalignment of the  thoracic or lumbar spine. Electronically Signed   By: Tish Frederickson M.D.   On: 02/19/2023 20:32    Pertinent labs & imaging results that were available during my care of the patient were reviewed by me and considered in my medical decision making (see MDM for details).  Medications Ordered in ED Medications  acetaminophen (TYLENOL) tablet 1,000 mg (has no administration in time range)  methocarbamol (ROBAXIN) tablet 500 mg (500 mg Oral Given 02/19/23 2124)    Or  methocarbamol (ROBAXIN) injection 500 mg ( Intravenous See Alternative 02/19/23 2124)  docusate sodium (COLACE) capsule 100 mg (100 mg Oral Given 02/19/23 2124)  polyethylene glycol (MIRALAX / GLYCOLAX) packet 17 g (has no administration in time range)  ondansetron (ZOFRAN-ODT) disintegrating tablet 4 mg (has no administration in time range)    Or  ondansetron (ZOFRAN) injection 4 mg (has no administration in time range)  metoprolol tartrate (LOPRESSOR) injection 5 mg (has no administration in time range)  hydrALAZINE (APRESOLINE) injection 10 mg (has no administration in time range)  enoxaparin (LOVENOX) injection 30 mg (has no administration in time range)  gabapentin (NEURONTIN) capsule 300 mg (300 mg Oral Given 02/19/23 2154)  ketorolac (TORADOL) 15 MG/ML injection 15 mg (has no administration in time range)  oxyCODONE (Oxy IR/ROXICODONE) immediate release tablet 5 mg (has no administration in time range)  melatonin tablet 3 mg (has no administration in time range)  HYDROmorphone (DILAUDID) injection 0.5 mg (0.5 mg Intravenous Given 02/19/23 2142)  levETIRAcetam (KEPPRA) IVPB 1000 mg/100 mL premix (0 mg Intravenous Stopped 02/19/23 2015)  fentaNYL (SUBLIMAZE) injection 200 mcg (200 mcg Intravenous Given 02/19/23 1950)  fentaNYL (SUBLIMAZE) injection 100 mcg (100 mcg Intravenous Given 02/19/23 1930)  iopamidol (ISOVUE-370) 76 % injection 75 mL (75 mLs Intravenous Contrast Given 02/19/23 2006)  oxyCODONE (Oxy IR/ROXICODONE)  immediate release tablet 5 mg (5 mg Oral Given 02/19/23 2124)  Procedures .Critical Care  Performed by: Arletha Pili, DO Authorized by: Arletha Pili, DO   Critical care provider statement:    Critical care time (minutes):  30   Critical care was necessary to treat or prevent imminent or life-threatening deterioration of the following conditions:  Trauma   Critical care was time spent personally by me on the following activities:  Development of treatment plan with patient or surrogate, discussions with consultants, evaluation of patient's response to treatment, examination of patient, ordering and review of laboratory studies, ordering and review of radiographic studies, ordering and performing treatments and interventions, pulse oximetry, re-evaluation of patient's condition and review of old charts   (including critical care time)  Medical Decision Making / ED Course   This patient presents to the ED for concern of seizure and fall, this involves an extensive number of treatment options, and is a complaint that carries with it a high risk of complications and morbidity.  The differential diagnosis includes polytrauma, fall from significant height, seizure disorder.  MDM: Patient immediately activated as a level 2 trauma.  With the mechanism of injury, high concern for intra-abdominal injury, thoracic injury, spine injury.  Patient had pulmonary edema on the left, likely pulmonary contusions.  High suspicion for rib fractures, left shoulder fracture.  Patient without any neurological deficits.  Did give the patient Keppra bolus given that he has not taken his medications.  Reassessment 8:30 PM-patient CT imaging shows multiple pulmonary contusions.  With the mechanism of injury, pulmonary contusions, believe patient is require overnight observation.   Discussed this with the trauma attending.  They agreed, patient admitted to their service.   Additional history obtained: -Additional history obtained from EMS -External records from outside source obtained and reviewed including: Chart review including previous notes, labs, imaging, consultation notes   Lab Tests: -I ordered, reviewed, and interpreted labs.   The pertinent results include:   Labs Reviewed  COMPREHENSIVE METABOLIC PANEL - Abnormal; Notable for the following components:      Result Value   Glucose, Bld 103 (*)    Total Protein 6.3 (*)    AST 45 (*)    All other components within normal limits  URINALYSIS, ROUTINE W REFLEX MICROSCOPIC - Abnormal; Notable for the following components:   Specific Gravity, Urine >1.046 (*)    Hgb urine dipstick SMALL (*)    Protein, ur 100 (*)    All other components within normal limits  CBC - Abnormal; Notable for the following components:   WBC 17.3 (*)    All other components within normal limits  I-STAT CHEM 8, ED - Abnormal; Notable for the following components:   BUN 23 (*)    Glucose, Bld 104 (*)    Calcium, Ion 1.12 (*)    All other components within normal limits  ETHANOL  PROTIME-INR  HIV ANTIBODY (ROUTINE TESTING W REFLEX)  CBC  BASIC METABOLIC PANEL  I-STAT CG4 LACTIC ACID, ED  SAMPLE TO BLOOD BANK      EKG my independent review the patient's EKG shows no ST segment depressions or elevations, no T wave versions, no evidence of acute ischemia.  EKG Interpretation Date/Time:    Ventricular Rate:    PR Interval:    QRS Duration:    QT Interval:    QTC Calculation:   R Axis:      Text Interpretation:           Imaging Studies ordered: I ordered imaging studies including trauma CT  imaging I independently visualized and interpreted imaging. I agree with the radiologist interpretation   Medicines ordered and prescription drug management: Meds ordered this encounter  Medications   fentaNYL  (SUBLIMAZE) 50 MCG/ML injection    Hollace Hayward D: cabinet override   levETIRAcetam (KEPPRA) IVPB 1000 mg/100 mL premix   fentaNYL (SUBLIMAZE) injection 200 mcg   fentaNYL (SUBLIMAZE) injection 100 mcg   iopamidol (ISOVUE-370) 76 % injection 75 mL   oxyCODONE (Oxy IR/ROXICODONE) immediate release tablet 5 mg   acetaminophen (TYLENOL) tablet 1,000 mg   OR Linked Order Group    methocarbamol (ROBAXIN) tablet 500 mg    methocarbamol (ROBAXIN) injection 500 mg   docusate sodium (COLACE) capsule 100 mg   polyethylene glycol (MIRALAX / GLYCOLAX) packet 17 g   OR Linked Order Group    ondansetron (ZOFRAN-ODT) disintegrating tablet 4 mg    ondansetron (ZOFRAN) injection 4 mg   metoprolol tartrate (LOPRESSOR) injection 5 mg   hydrALAZINE (APRESOLINE) injection 10 mg   enoxaparin (LOVENOX) injection 30 mg   gabapentin (NEURONTIN) capsule 300 mg   ketorolac (TORADOL) 15 MG/ML injection 15 mg   oxyCODONE (Oxy IR/ROXICODONE) immediate release tablet 5 mg   melatonin tablet 3 mg   HYDROmorphone (DILAUDID) injection 0.5 mg    -I have reviewed the patients home medicines and have made adjustments as needed  Critical interventions Management of high mechanism trauma  Consultations Obtained: I requested consultation with the trauma surgery,  and discussed lab and imaging findings as well as pertinent plan - they recommend: Admission   Cardiac Monitoring: The patient was maintained on a cardiac monitor.  I personally viewed and interpreted the cardiac monitored which showed an underlying rhythm of: Normal sinus rhythm  Social Determinants of Health:  Factors impacting patients care include: History of opioid abuse   Reevaluation: After the interventions noted above, I reevaluated the patient and found that they have :improved  Co morbidities that complicate the patient evaluation  Past Medical History:  Diagnosis Date   Seizures (HCC)       Dispostion: Admission to trauma  service     Final Clinical Impression(s) / ED Diagnoses Final diagnoses:  Fall, initial encounter  Contusion of both lungs, initial encounter  Critical polytrauma     @PCDICTATION @    Anders Simmonds T, DO 02/19/23 2236

## 2023-02-19 NOTE — ED Notes (Signed)
Pt is standing at bedside with wifes help using urinal.

## 2023-02-20 ENCOUNTER — Observation Stay (HOSPITAL_COMMUNITY): Payer: Medicaid Other

## 2023-02-20 LAB — BASIC METABOLIC PANEL
Anion gap: 8 (ref 5–15)
BUN: 20 mg/dL (ref 6–20)
CO2: 24 mmol/L (ref 22–32)
Calcium: 9 mg/dL (ref 8.9–10.3)
Chloride: 102 mmol/L (ref 98–111)
Creatinine, Ser: 0.92 mg/dL (ref 0.61–1.24)
GFR, Estimated: >60 mL/min (ref >60)
Glucose, Bld: 106 mg/dL — ABNORMAL HIGH (ref 70–99)
Potassium: 3.9 mmol/L (ref 3.5–5.1)
Sodium: 134 mmol/L — ABNORMAL LOW (ref 135–145)

## 2023-02-20 LAB — CBC
HCT: 40.1 % (ref 39.0–52.0)
Hemoglobin: 13.9 g/dL (ref 13.0–17.0)
MCH: 29.6 pg (ref 26.0–34.0)
MCHC: 34.7 g/dL (ref 30.0–36.0)
MCV: 85.5 fL (ref 80.0–100.0)
Platelets: 254 10*3/uL (ref 150–400)
RBC: 4.69 MIL/uL (ref 4.22–5.81)
RDW: 11.9 % (ref 11.5–15.5)
WBC: 8.9 10*3/uL (ref 4.0–10.5)
nRBC: 0 % (ref 0.0–0.2)

## 2023-02-20 LAB — HIV ANTIBODY (ROUTINE TESTING W REFLEX): HIV Screen 4th Generation wRfx: NONREACTIVE

## 2023-02-20 MED ORDER — METHOCARBAMOL 500 MG PO TABS: 500.0000 mg | ORAL_TABLET | Freq: Four times a day (QID) | ORAL | 0 refills | Status: AC | PRN

## 2023-02-20 MED ORDER — POLYETHYLENE GLYCOL 3350 17 G PO PACK: 17.0000 g | PACK | Freq: Every day | ORAL | Status: AC | PRN

## 2023-02-20 MED ORDER — OXYCODONE HCL 5 MG PO TABS: 5.0000 mg | ORAL_TABLET | Freq: Four times a day (QID) | ORAL | 0 refills | Status: AC | PRN

## 2023-02-20 MED ORDER — IBUPROFEN 600 MG PO TABS: 600.0000 mg | ORAL_TABLET | Freq: Three times a day (TID) | ORAL | Status: AC | PRN

## 2023-02-20 MED ORDER — ACETAMINOPHEN 500 MG PO TABS: 1000.0000 mg | ORAL_TABLET | Freq: Four times a day (QID) | ORAL | Status: AC | PRN

## 2023-02-20 MED ORDER — LEVETIRACETAM 500 MG PO TABS
1000.0000 mg | ORAL_TABLET | Freq: Two times a day (BID) | ORAL | Status: DC
  Administered 2023-02-20: 1000 mg via ORAL
  Filled 2023-02-20: qty 2

## 2023-02-20 MED ORDER — DOCUSATE SODIUM 100 MG PO CAPS: 100.0000 mg | ORAL_CAPSULE | Freq: Two times a day (BID) | ORAL | Status: AC | PRN

## 2023-02-20 NOTE — Evaluation (Signed)
Physical Therapy Evaluation Patient Details Name: Edward Harper MRN: [id number] DOB: 12-17-1990 Today's Date: 02/20/2023  History of Present Illness  32 y.o. male presents to [facility] hospital after falling out of a deer stand, potential seizure prior to fall. Pt with R 1st costochondral fx as well as pulmonary contusions. PMH includes seizure disorder, substance abuse.  Clinical Impression  Pt presents to PT with deficits in activity tolerance, strength, power, gait. Pt is limited by pain in back, L shoulder and chest pain. Pt is able to mobilize for household distances with support of RW, also needing some physical assistance to perform bed mobility. Pt's spouse reports she works from home and will be available to assist him when needed. PT provides encouragement for frequent mobilization as well as use of incentive spirometer. PT will continue to follow during this admission, recommending discharge home when medically appropriate. Pt may need a RW if he is unable to loan one from his family.      If plan is discharge home, recommend the following: A little help with walking and/or transfers;A little help with bathing/dressing/bathroom;Assistance with cooking/housework;Assist for transportation   Can travel by private vehicle        Equipment Recommendations Rolling walker (2 wheels) (if patient's father does not have one to loan him)  Recommendations for Other Services       Functional Status Assessment Patient has had a recent decline in their functional status and demonstrates the ability to make significant improvements in function in a reasonable and predictable amount of time.     Precautions / Restrictions Precautions Precautions: Fall Precaution Comments: hx of seizure Restrictions Weight Bearing Restrictions: No      Mobility  Bed Mobility Overal bed mobility: Needs Assistance Bed Mobility: Supine to Sit     Supine to sit: Min assist     General bed mobility comments:  hand hold, pt pulls through PT UE for support    Transfers Overall transfer level: Needs assistance Equipment used: Rolling walker (2 wheels) Transfers: Sit to/from Stand Sit to Stand: Contact guard assist                Ambulation/Gait Ambulation/Gait assistance: Supervision Gait Distance (Feet): 300 Feet (20' without UE support when going to bathroom) Assistive device: Rolling walker (2 wheels) Gait Pattern/deviations: Step-through pattern Gait velocity: reduced Gait velocity interpretation: <1.8 ft/sec, indicate of risk for recurrent falls   General Gait Details: slowed step-through gait, intermittent pauses due to pain related to apparent muscle spasms  Stairs Stairs: Yes Stairs assistance: Supervision Stair Management: One rail Right, Forwards Number of Stairs: 5    Wheelchair Mobility     Tilt Bed    Modified Rankin (Stroke Patients Only)       Balance Overall balance assessment: Needs assistance Sitting-balance support: No upper extremity supported, Feet supported Sitting balance-Leahy Scale: Good     Standing balance support: No upper extremity supported, During functional activity Standing balance-Leahy Scale: Fair                               Pertinent Vitals/Pain Pain Assessment Pain Assessment: 0-10 Pain Score: 9  Pain Location: back and L shoulder/chest Pain Descriptors / Indicators: Spasm Pain Intervention(s): RN gave pain meds during session    Home Living Family/patient expects to be discharged to:: Private residence Living Arrangements: Spouse/significant other Available Help at Discharge: Family;Available 24 hours/day Type of Home: House Home Access: Stairs to  enter Entrance Stairs-Rails: Can reach both Entrance Stairs-Number of Steps: 3   Home Layout: One level Home Equipment: Cane - single point (pt reports his father has DME he can borrow, unsure if he owns a RW)      Prior Function Prior Level of Function :  Independent/Modified Independent;Driving                     Extremity/Trunk Assessment   Upper Extremity Assessment Upper Extremity Assessment: LUE deficits/detail LUE Deficits / Details: shoulder flexion to 90 degrees observed actively, not fully assessed due to pain at ribs    Lower Extremity Assessment Lower Extremity Assessment: Overall WFL for tasks assessed    Cervical / Trunk Assessment Cervical / Trunk Assessment: Normal  Communication   Communication Communication: No apparent difficulties Cueing Techniques: Verbal cues  Cognition Arousal: Alert Behavior During Therapy: WFL for tasks assessed/performed Overall Cognitive Status: Within Functional Limits for tasks assessed                                          General Comments General comments (skin integrity, edema, etc.): pt on RA, sats not reading well with ambulation but at 92% immediately after completing ambulation. Hemptysis noted with 2 instances of coughing, trauma PA made aware    Exercises     Assessment/Plan    PT Assessment Patient needs continued PT services  PT Problem List Decreased activity tolerance;Decreased mobility;Decreased strength;Decreased balance;Decreased knowledge of use of DME;Pain       PT Treatment Interventions DME instruction;Gait training;Stair training;Functional mobility training;Therapeutic activities;Balance training;Patient/family education    PT Goals (Current goals can be found in the Care Plan section)  Acute Rehab PT Goals Patient Stated Goal: to return home ASAP PT Goal Formulation: With patient Time For Goal Achievement: 03/06/23 Potential to Achieve Goals: Good Additional Goals Additional Goal #1: Pt will score >19/24 on the DGI to indicate a reduced risk for falls Additional Goal #2: Pt will report 0/4 DOE when ambulating for >500' to demonstrate improved activity tolerance    Frequency Min 1X/week     Co-evaluation                AM-PAC PT "6 Clicks" Mobility  Outcome Measure Help needed turning from your back to your side while in a flat bed without using bedrails?: A Little Help needed moving from lying on your back to sitting on the side of a flat bed without using bedrails?: A Little Help needed moving to and from a bed to a chair (including a wheelchair)?: A Little Help needed standing up from a chair using your arms (e.g., wheelchair or bedside chair)?: A Little Help needed to walk in hospital room?: A Little Help needed climbing 3-5 steps with a railing? : A Little 6 Click Score: 18    End of Session   Activity Tolerance: Patient tolerated treatment well Patient left: in chair;with call bell/phone within reach;with chair alarm set Nurse Communication: Mobility status PT Visit Diagnosis: Other abnormalities of gait and mobility (R26.89);Pain Pain - Right/Left: Left Pain - part of body: Shoulder    Time: 1610-9604 PT Time Calculation (min) (ACUTE ONLY): 14 min   Charges:   PT Evaluation $PT Eval Low Complexity: 1 Low   PT General Charges $$ ACUTE PT VISIT: 1 Visit         Arlyss Gandy, PT, DPT  Acute Rehabilitation Office [phone]   Arlyss Gandy 02/20/2023, 10:41 AM

## 2023-02-20 NOTE — ED Notes (Signed)
Patient stood at bedside to use the urinal after we asked pt not to stand up, after he finished he called out for the nurse to help him get back into bed. When this RN attempted to help pt up, pt stated that this RN would need a man to help him up. This RN had male Rns assist pt back into bed.

## 2023-02-20 NOTE — Progress Notes (Signed)
Patient arrived on the floor from the ED complaining of pain. Transferred patient from ED bed to floor bed. Patient began asking for the doctor and that he was going to leave AMA. Patient refused to be assessed, turned, and wiped with CHG wipes. Patient stated, "the women are against him and we are all one." Patient also stated only the IV dilaudid is working for him and got angry when I offered oxycodone for his pain. Dr. Fredricka Bonine notified.

## 2023-02-20 NOTE — Evaluation (Signed)
Occupational Therapy Evaluation Patient Details Name: Edward Harper MRN: [id number] DOB: Jul 14, 1990 Today's Date: 02/20/2023   History of Present Illness 32 y.o. male presents to [facility] hospital after falling out of a deer stand, potential seizure prior to fall. Pt with R 1st costochondral fx as well as pulmonary contusions. PMH includes seizure disorder, substance abuse.   Clinical Impression   PTA, pt lived with his wife and was independent in ADL, IADL, working. Upon eval, pt significantly limited by pain and decr tolerance of ROM in BUE (L>R). Pt needing mod A for LB ADL and up to max A for UB ADL. Discussed suspected significant progression with pain management and healing of sift tissue injuries/costochondral fx, but pt with concerns related to being able to return to work. Recommending OP OT to optimize return to PLOF after acute phase of healing.       If plan is discharge home, recommend the following: A little help with walking and/or transfers;A lot of help with bathing/dressing/bathroom;Assistance with cooking/housework;Assist for transportation;Help with stairs or ramp for entrance    Functional Status Assessment  Patient has had a recent decline in their functional status and demonstrates the ability to make significant improvements in function in a reasonable and predictable amount of time.  Equipment Recommendations  Tub/shower seat    Recommendations for Other Services       Precautions / Restrictions Precautions Precautions: Fall Precaution Comments: hx of seizure Restrictions Weight Bearing Restrictions: No      Mobility Bed Mobility Overal bed mobility: Needs Assistance Bed Mobility: Supine to Sit     Supine to sit: Min assist     General bed mobility comments: hand hold, pt pulls through PT UE for support    Transfers Overall transfer level: Needs assistance Equipment used: Rolling walker (2 wheels) Transfers: Sit to/from Stand Sit to Stand: Contact  guard assist                  Balance Overall balance assessment: Needs assistance Sitting-balance support: No upper extremity supported, Feet supported Sitting balance-Leahy Scale: Good     Standing balance support: No upper extremity supported, During functional activity Standing balance-Leahy Scale: Fair                             ADL either performed or assessed with clinical judgement   ADL Overall ADL's : Needs assistance/impaired Eating/Feeding: Independent;Sitting   Grooming: Standing;Minimal assistance   Upper Body Bathing: Maximal assistance;Sitting   Lower Body Bathing: Moderate assistance   Upper Body Dressing : Maximal assistance;Sitting Upper Body Dressing Details (indicate cue type and reason): reviewed provision of assist with wife present in room. Educated regarding compensatory techniques Lower Body Dressing: Moderate assistance;Sit to/from stand Lower Body Dressing Details (indicate cue type and reason): for donning sock LLE and putting LLE into pants Toilet Transfer: Contact guard assist;Rolling walker (2 wheels)   Toileting- Clothing Manipulation and Hygiene: Contact guard assist       Functional mobility during ADLs: Contact guard assist;Rolling walker (2 wheels)       Vision Baseline Vision/History: 0 No visual deficits       Perception Perception: Not tested       Praxis Praxis: Not tested       Pertinent Vitals/Pain Pain Assessment Pain Assessment: Faces Faces Pain Scale: Hurts whole lot Pain Location: back and L shoulder/chest Pain Descriptors / Indicators: Spasm, Crying, Guarding, Grimacing, Sore Pain Intervention(s): Limited activity  within patient's tolerance, Monitored during session     Extremity/Trunk Assessment Upper Extremity Assessment Upper Extremity Assessment: RUE deficits/detail;LUE deficits/detail;Right hand dominant RUE Deficits / Details: decr tolerance of shoulder flexion; tolerating 90 degrees  this session LUE Deficits / Details: shoulder flexion to 70 degrees observed actively, not fully assessed due to pain at ribs and pt calling out in pain. Pt able to perform elbow flexion to ~110 degrees within limits of pain. Generally weak with grasp   Lower Extremity Assessment Lower Extremity Assessment: Defer to PT evaluation (L buttock pain)   Cervical / Trunk Assessment Cervical / Trunk Assessment: Normal   Communication Communication Communication: No apparent difficulties Cueing Techniques: Verbal cues   Cognition Arousal: Alert Behavior During Therapy: WFL for tasks assessed/performed Overall Cognitive Status: Within Functional Limits for tasks assessed                                 General Comments: distracted by his pain     General Comments  VSS on RA for session. Pt with significant concern for "coughing up blood" per PT note, trauma PA aware    Exercises     Shoulder Instructions      Home Living Family/patient expects to be discharged to:: Private residence Living Arrangements: Spouse/significant other Available Help at Discharge: Family;Available 24 hours/day Type of Home: House Home Access: Stairs to enter [employer] of Steps: 3 Entrance Stairs-Rails: Can reach both Home Layout: One level     Bathroom Shower/Tub: Chief Strategy Officer: Standard     Home Equipment: Gilmer Mor - single point (pt reports his father has DME he can borrow, unsure if he owns a RW)   Additional Comments: Pt reports he can go to his aunts house nearby and use her walk in shower if needed      Prior Functioning/Environment Prior Level of Function : Independent/Modified Independent;Driving;Working/employed               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 Problem List: Decreased strength;Decreased activity tolerance;Decreased range of motion;Impaired balance (sitting and/or standing);Decreased safety awareness;Decreased  knowledge of use of DME or AE;Pain;Impaired UE functional use      OT Treatment/Interventions: Therapeutic exercise;Self-care/ADL training;DME and/or AE instruction;Balance training;Patient/family education;Therapeutic activities    OT Goals(Current goals can be found in the care plan section) Acute Rehab OT Goals Patient Stated Goal: go home OT Goal Formulation: With patient/family Time For Goal Achievement: 03/06/23 Potential to Achieve Goals: Good  OT Frequency: Min 1X/week    Co-evaluation              AM-PAC OT "6 Clicks" Daily Activity     Outcome Measure Help from another person eating meals?: None Help from another person taking care of personal grooming?: A Little Help from another person toileting, which includes using toliet, bedpan, or urinal?: A Little Help from another person bathing (including washing, rinsing, drying)?: A Lot Help from another person to put on and taking off regular upper body clothing?: A Lot Help from another person to put on and taking off regular lower body clothing?: A Lot 6 Click Score: 16   End of Session Equipment Utilized During Treatment: Gait belt;Rolling walker (2 wheels) Nurse Communication: Mobility status  Activity Tolerance: Patient tolerated treatment well Patient left: in chair;with call bell/phone within reach  OT Visit Diagnosis: Unsteadiness on feet (R26.81);Muscle weakness (generalized) (  M62.81);Pain Pain - Right/Left: Left Pain - part of body: Shoulder (back, L buttock)                Time: 1030-1101 OT Time Calculation (min): 31 min Charges:  OT General Charges $OT Visit: 1 Visit OT Evaluation $OT Eval Low Complexity: 1 Low OT Treatments $Self Care/Home Management : 8-22 mins  Tyler Deis, OTR/L [facility] Acute Rehabilitation Office: [phone]   Myrla Halsted 02/20/2023, 11:21 AM

## 2023-02-20 NOTE — ED Notes (Signed)
ED TO INPATIENT HANDOFF REPORT  ED Nurse Name and Phone #: Darral Dash RN [phone]  S Name/Age/Gender Edward Harper 32 y.o. male Room/Bed: 001C/001C  Code Status   Code Status: Full Code  Home/SNF/Other Home Patient oriented to: self, place, time, and situation Is this baseline? Yes   Triage Complete: Triage complete  Chief Complaint Pulmonary contusion [S27.329A]  Triage Note Pt BIB [facility] pt had a 38ft fall from a tree after having an episode of seizure, unknown seizure time. Pt called wife and family brought pt to EMS. Per EMS, pt has a left shoulder deformity, bruising to his right side.  Gave 200 mcg of Fentanyl en route last dose of 50 mcg given at 1915.     Allergies Allergies  Allergen Reactions   Penicillins Rash    Level of Care/Admitting Diagnosis ED Disposition     ED Disposition  Admit   Condition  --   Comment  Hospital Area: [facility] [[id number]]  Level of Care: Med-Surg [16]  May place patient in observation at [facility] or [facility] if equivalent level of care is available:: No  Covid Evaluation: Asymptomatic - no recent exposure (last 10 days) testing not required  Diagnosis: Pulmonary contusion [454098]  Admitting Physician: TRAUMA MD [[id number]]  Attending Physician: TRAUMA MD [[id number]]  For patients discharging to extended facilities (i.e. SNF, AL, group homes or LTAC) initiate:: Discharge to SNF/Facility Placement COVID-19 Lab Testing Protocol          B Medical/Surgery History Past Medical History:  Diagnosis Date   Seizures (HCC)    History reviewed. No pertinent surgical history.   A IV Location/Drains/Wounds Patient Lines/Drains/Airways Status     Active Line/Drains/Airways     Name Placement date Placement time Site Days   Peripheral IV 02/19/23 16 G Right Antecubital 02/19/23  --  Antecubital  1   Peripheral IV 02/19/23 20 G 1.88" Right Forearm 02/19/23  2026  Forearm  1            Intake/Output  Last 24 hours No intake or output data in the 24 hours ending 02/20/23 0156  Labs/Imaging Results for orders placed or performed during the hospital encounter of 02/19/23 (from the past 48 hour(s))  Comprehensive metabolic panel     Status: Abnormal   Collection Time: 02/19/23  7:28 PM  Result Value Ref Range   Sodium 135 135 - 145 mmol/L   Potassium 4.0 3.5 - 5.1 mmol/L   Chloride 102 98 - 111 mmol/L   CO2 24 22 - 32 mmol/L   Glucose, Bld 103 (H) 70 - 99 mg/dL    Comment: Glucose reference range applies only to samples taken after fasting for at least 8 hours.   BUN 19 6 - 20 mg/dL   Creatinine, Ser 1.19 0.61 - 1.24 mg/dL   Calcium 9.1 8.9 - 14.7 mg/dL   Total Protein 6.3 (L) 6.5 - 8.1 g/dL   Albumin 3.9 3.5 - 5.0 g/dL   AST 45 (H) 15 - 41 U/L   ALT 38 0 - 44 U/L   Alkaline Phosphatase 60 38 - 126 U/L   Total Bilirubin 0.6 <1.2 mg/dL   GFR, Estimated >82 >95 mL/min    Comment: (NOTE) Calculated using the CKD-EPI Creatinine Equation (2021)    Anion gap 9 5 - 15    Comment: Performed at [facility], [address] [place], [place] [address]  Ethanol     Status: None  Collection Time: 02/19/23  7:28 PM  Result Value Ref Range   Alcohol, Ethyl (B) <10 <10 mg/dL    Comment: (NOTE) Lowest detectable limit for serum alcohol is 10 mg/dL.  For medical purposes only. Performed at [facility], [address] [place], [place] [address]   Protime-INR     Status: None   Collection Time: 02/19/23  7:28 PM  Result Value Ref Range   Prothrombin Time 13.2 11.4 - 15.2 seconds   INR 1.0 0.8 - 1.2    Comment: (NOTE) INR goal varies based on device and disease states. Performed at [facility], [address], [place], [place] [address]   Sample to Blood Bank     Status: None   Collection Time: 02/19/23  7:40 PM  Result Value Ref Range   Blood Bank Specimen SAMPLE AVAILABLE FOR TESTING    Sample Expiration      02/22/2023,2359 Performed at [facility], [address] [place], [place] [address]   I-Stat Chem 8, ED     Status: Abnormal   Collection Time: 02/19/23  7:47 PM  Result Value Ref Range   Sodium 138 135 - 145 mmol/L   Potassium 3.8 3.5 - 5.1 mmol/L   Chloride 102 98 - 111 mmol/L   BUN 23 (H) 6 - 20 mg/dL   Creatinine, Ser 9.14 0.61 - 1.24 mg/dL   Glucose, Bld 782 (H) 70 - 99 mg/dL    Comment: Glucose reference range applies only to samples taken after fasting for at least 8 hours.   Calcium, Ion 1.12 (L) 1.15 - 1.40 mmol/L   TCO2 25 22 - 32 mmol/L   Hemoglobin 13.9 13.0 - 17.0 g/dL   HCT 95.6 21.3 - 08.6 %  I-Stat Lactic Acid, ED     Status: None   Collection Time: 02/19/23  7:47 PM  Result Value Ref Range   Lactic Acid, Venous 1.7 0.5 - 1.9 mmol/L  CBC     Status: Abnormal   Collection Time: 02/19/23  8:32 PM  Result Value Ref Range   WBC 17.3 (H) 4.0 - 10.5 K/uL   RBC 4.60 4.22 - 5.81 MIL/uL   Hemoglobin 13.8 13.0 - 17.0 g/dL   HCT 57.8 46.9 - 62.9 %   MCV 86.1 80.0 - 100.0 fL   MCH 30.0 26.0 - 34.0 pg   MCHC 34.8 30.0 - 36.0 g/dL   RDW 52.8 41.3 - 24.4 %   Platelets 283 150 - 400 K/uL   nRBC 0.0 0.0 - 0.2 %    Comment: Performed at [facility], [address] [place], [place] [address]  Urinalysis, Routine w reflex microscopic -Urine, Clean Catch     Status: Abnormal   Collection Time: 02/19/23 10:15 PM  Result Value Ref Range   Color, Urine YELLOW YELLOW   APPearance CLEAR CLEAR   Specific Gravity, Urine >1.046 (H) 1.005 - 1.030   pH 5.0 5.0 - 8.0   Glucose, UA NEGATIVE NEGATIVE mg/dL   Hgb urine dipstick SMALL (A) NEGATIVE   Bilirubin Urine NEGATIVE NEGATIVE   Ketones, ur NEGATIVE NEGATIVE mg/dL   Protein, ur 253 (A) NEGATIVE mg/dL   Nitrite NEGATIVE NEGATIVE   Leukocytes,Ua NEGATIVE NEGATIVE   RBC / HPF 6-10 0 - 5 RBC/hpf   WBC, UA 0-5 0 - 5 WBC/hpf   Bacteria, UA NONE SEEN NONE SEEN   Squamous Epithelial / HPF 0-5 0 - 5 /HPF   Mucus  PRESENT    Hyaline Casts, UA PRESENT     Comment:  Performed at [facility], [address] [place], [place] [address]   DG Shoulder Left Port  Result Date: 02/19/2023 CLINICAL DATA:  Status post seizure and subsequent fall from a tree. EXAM: LEFT SHOULDER COMPARISON:  None Available. FINDINGS: There is no evidence of fracture or dislocation. There is no evidence of arthropathy or other focal bone abnormality. Soft tissues are unremarkable. IMPRESSION: Negative. Electronically Signed   By: Aram Candela M.D.   On: 02/19/2023 23:30   CT HEAD WO CONTRAST  Result Date: 02/19/2023 CLINICAL DATA:  Polytrauma, blunt; Head trauma, moderate-severe pt had a 67ft fall from a tree EXAM: CT HEAD WITHOUT CONTRAST CT CERVICAL SPINE WITHOUT CONTRAST TECHNIQUE: Multidetector CT imaging of the head and cervical spine was performed following the standard protocol without intravenous contrast. Multiplanar CT image reconstructions of the cervical spine were also generated. RADIATION DOSE REDUCTION: This exam was performed according to the departmental dose-optimization program which includes automated exposure control, adjustment of the mA and/or kV according to patient size and/or use of iterative reconstruction technique. COMPARISON:  None Available. FINDINGS: CT HEAD FINDINGS Brain: No evidence of large-territorial acute infarction. No parenchymal hemorrhage. No mass lesion. No extra-axial collection. No mass effect or midline shift. No hydrocephalus. Basilar cisterns are patent. Vascular: No hyperdense vessel. Skull: No acute fracture or focal lesion. Sinuses/Orbits: Paranasal sinuses and mastoid air cells are clear. The orbits are unremarkable. Other: None. CT CERVICAL SPINE FINDINGS Alignment: Normal. Skull base and vertebrae: No acute fracture. No aggressive appearing focal osseous lesion or focal pathologic process. Soft tissues and spinal canal: No prevertebral fluid or swelling. No visible canal hematoma. Upper chest: Unremarkable. Other: None.  IMPRESSION: 1. No acute intracranial abnormality. 2. No acute displaced fracture or traumatic listhesis of the cervical spine. Electronically Signed   By: Tish Frederickson M.D.   On: 02/19/2023 20:40   CT CERVICAL SPINE WO CONTRAST  Result Date: 02/19/2023 CLINICAL DATA:  Polytrauma, blunt; Head trauma, moderate-severe pt had a 52ft fall from a tree EXAM: CT HEAD WITHOUT CONTRAST CT CERVICAL SPINE WITHOUT CONTRAST TECHNIQUE: Multidetector CT imaging of the head and cervical spine was performed following the standard protocol without intravenous contrast. Multiplanar CT image reconstructions of the cervical spine were also generated. RADIATION DOSE REDUCTION: This exam was performed according to the departmental dose-optimization program which includes automated exposure control, adjustment of the mA and/or kV according to patient size and/or use of iterative reconstruction technique. COMPARISON:  None Available. FINDINGS: CT HEAD FINDINGS Brain: No evidence of large-territorial acute infarction. No parenchymal hemorrhage. No mass lesion. No extra-axial collection. No mass effect or midline shift. No hydrocephalus. Basilar cisterns are patent. Vascular: No hyperdense vessel. Skull: No acute fracture or focal lesion. Sinuses/Orbits: Paranasal sinuses and mastoid air cells are clear. The orbits are unremarkable. Other: None. CT CERVICAL SPINE FINDINGS Alignment: Normal. Skull base and vertebrae: No acute fracture. No aggressive appearing focal osseous lesion or focal pathologic process. Soft tissues and spinal canal: No prevertebral fluid or swelling. No visible canal hematoma. Upper chest: Unremarkable. Other: None. IMPRESSION: 1. No acute intracranial abnormality. 2. No acute displaced fracture or traumatic listhesis of the cervical spine. Electronically Signed   By: Tish Frederickson M.D.   On: 02/19/2023 20:40   DG Pelvis Portable  Result Date: 02/19/2023 CLINICAL DATA:  Trauma EXAM: PORTABLE PELVIS 1-2  VIEWS COMPARISON:  CT abdomen pelvis 02/19/2023 FINDINGS: There is no evidence  of pelvic fracture or diastasis. No pelvic bone lesions are seen. IMPRESSION: Negative. Electronically Signed   By: Tish Frederickson M.D.   On: 02/19/2023 20:34   DG Chest Port 1 View  Result Date: 02/19/2023 CLINICAL DATA:  Trauma EXAM: PORTABLE CHEST 1 VIEW COMPARISON:  CT chest 02/19/2023 FINDINGS: The heart and mediastinal contours are within normal limits. Patchy airspace opacities better evaluated on CT. No pulmonary edema. No pleural effusion. No pneumothorax. No acute osseous abnormality. IMPRESSION: Patchy airspace opacities better evaluated on CT. Electronically Signed   By: Tish Frederickson M.D.   On: 02/19/2023 20:32   CT CHEST ABDOMEN PELVIS W CONTRAST  Result Date: 02/19/2023 CLINICAL DATA:  Polytrauma, blunt  a 24ft fall from a tree EXAM: CT CHEST, ABDOMEN, AND PELVIS WITH CONTRAST TECHNIQUE: Multidetector CT imaging of the chest, abdomen and pelvis was performed following the standard protocol during bolus administration of intravenous contrast. RADIATION DOSE REDUCTION: This exam was performed according to the departmental dose-optimization program which includes automated exposure control, adjustment of the mA and/or kV according to patient size and/or use of iterative reconstruction technique. CONTRAST:  75mL ISOVUE-370 IOPAMIDOL (ISOVUE-370) INJECTION 76% COMPARISON:  None Available. FINDINGS: CHEST: Cardiovascular: No aortic injury. The thoracic aorta is normal in caliber. The heart is normal in size. No significant pericardial effusion. Mediastinum/Nodes: Likely trace residual thymus within the anterior mediastinum. No pneumomediastinum. No mediastinal hematoma. The esophagus is unremarkable. The thyroid is unremarkable. The central airways are patent. No mediastinal, hilar, or axillary lymphadenopathy. Lungs/Pleura: Peripheral ground-glass airspace opacities within the right lower, bilateral upper lobe  suggestive of developing pulmonary contusions. No pulmonary nodule. No pulmonary mass. No pulmonary laceration. No pneumatocele formation. No pleural effusion. No pneumothorax. No hemothorax. Musculoskeletal/Chest wall: No chest wall mass. Left posterior back subcutaneus soft tissue edema versus developing hematoma. No acute sternal fracture. Question anterior right first costochondral junction fracture (6:63). No spinal fracture. 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 ABDOMEN / PELVIS: Hepatobiliary: Not enlarged. No focal lesion. No laceration or subcapsular hematoma. The gallbladder is otherwise unremarkable with no radio-opaque gallstones. No biliary ductal dilatation. Pancreas: Normal pancreatic contour. No main pancreatic duct dilatation. Spleen: Not enlarged. No focal lesion. No laceration, subcapsular hematoma, or vascular injury. Adrenals/Urinary Tract: No nodularity bilaterally. Bilateral kidneys enhance symmetrically. No hydronephrosis. No contusion, laceration, or subcapsular hematoma. No injury to the vascular structures or collecting systems. No hydroureter. The urinary bladder is unremarkable. On delayed imaging, there is no urothelial wall thickening and there are no filling defects in the opacified portions of the bilateral collecting systems or ureters. Stomach/Bowel: No small or large bowel wall thickening or dilatation. The appendix is unremarkable. Vasculature/Lymphatics: No abdominal aorta or iliac aneurysm. No active contrast extravasation or pseudoaneurysm. No abdominal, pelvic, inguinal lymphadenopathy. Reproductive: Normal. Other: No simple free fluid ascites. No pneumoperitoneum. No hemoperitoneum. No mesenteric hematoma identified. No organized fluid collection. Musculoskeletal: Left hip subcutaneus soft tissue hematoma along the fascia lata with no definite Norma Fredrickson lesion identified. No acute pelvic fracture. No spinal fracture. Ports and Devices: None. IMPRESSION: 1. Question  anterior right first costochondral junction fracture. Correlate with point tenderness to palpation. 2. Peripheral ground-glass airspace opacities within the right lower, bilateral upper lobe suggestive of developing pulmonary contusions. 3. Left hip subcutaneus soft tissue edema. No definite Norma Fredrickson lesion . Recommend attention on follow-up. No underlying pelvic fracture. 4. Left posterior back subcutaneus soft tissue edema versus developing hematoma. 5. No acute intra-abdominal or intrapelvic traumatic injury. 6. No acute fracture or traumatic  malalignment of the thoracic or lumbar spine. Electronically Signed   By: Tish Frederickson M.D.   On: 02/19/2023 20:32    Pending Labs Unresulted Labs (From admission, onward)     Start     Ordered   02/20/23 0500  CBC  Tomorrow morning,   R        02/19/23 2113   02/20/23 0500  Basic metabolic panel  Tomorrow morning,   R        02/19/23 2113   02/19/23 2114  HIV Antibody (routine testing w rflx)  (HIV Antibody (Routine testing w reflex) panel)  Once,   R        02/19/23 2113            Vitals/Pain Today's Vitals   02/20/23 0028 02/20/23 0030 02/20/23 0100 02/20/23 0145  BP:  116/66 105/63 107/62  Pulse:  73 70 72  Resp:  18 15 (!) 28  Temp: 98.5 F (36.9 C)     TempSrc: Oral     SpO2:  98% 91% 91%  Weight:      Height:      PainSc:        Isolation Precautions No active isolations  Medications Medications  acetaminophen (TYLENOL) tablet 1,000 mg (1,000 mg Oral Patient Refused/Not Given 02/20/23 0027)  methocarbamol (ROBAXIN) tablet 500 mg (500 mg Oral Given 02/19/23 2124)    Or  methocarbamol (ROBAXIN) injection 500 mg ( Intravenous See Alternative 02/19/23 2124)  docusate sodium (COLACE) capsule 100 mg (100 mg Oral Given 02/19/23 2124)  polyethylene glycol (MIRALAX / GLYCOLAX) packet 17 g (has no administration in time range)  ondansetron (ZOFRAN-ODT) disintegrating tablet 4 mg (has no administration in time range)    Or   ondansetron (ZOFRAN) injection 4 mg (has no administration in time range)  metoprolol tartrate (LOPRESSOR) injection 5 mg (has no administration in time range)  hydrALAZINE (APRESOLINE) injection 10 mg (has no administration in time range)  enoxaparin (LOVENOX) injection 30 mg (has no administration in time range)  gabapentin (NEURONTIN) capsule 300 mg (300 mg Oral Given 02/19/23 2154)  ketorolac (TORADOL) 15 MG/ML injection 15 mg (has no administration in time range)  oxyCODONE (Oxy IR/ROXICODONE) immediate release tablet 5 mg (has no administration in time range)  melatonin tablet 3 mg (has no administration in time range)  HYDROmorphone (DILAUDID) injection 0.5 mg (0.5 mg Intravenous Given 02/19/23 2142)  levETIRAcetam (KEPPRA) IVPB 1000 mg/100 mL premix (0 mg Intravenous Stopped 02/19/23 2015)  fentaNYL (SUBLIMAZE) injection 200 mcg (200 mcg Intravenous Given 02/19/23 1950)  fentaNYL (SUBLIMAZE) injection 100 mcg (100 mcg Intravenous Given 02/19/23 1930)  iopamidol (ISOVUE-370) 76 % injection 75 mL (75 mLs Intravenous Contrast Given 02/19/23 2006)  oxyCODONE (Oxy IR/ROXICODONE) immediate release tablet 5 mg (5 mg Oral Given 02/19/23 2124)    Mobility walks     Focused Assessments    R Recommendations: See Admitting Provider Note  Report given to:   Additional Notes: Pt has a history of Epilepsy, pt had not taken his meds for two days, he received his Keppra today in the ED. Pt is able to stand by bedside to use urinal.

## 2023-02-20 NOTE — ED Notes (Signed)
Pt

## 2023-02-20 NOTE — Progress Notes (Signed)
Progress Note     Subjective: Having pain in left shoulder and back. Shoulder mobility limited secondary to pain. Feels a little short of breath and pain worsened with coughing - coughing up some red tinged sputum. He has ambulated since admission without any new complaints. Tolerating diet without nausea/vomiting abdominal pain. He takes suboxone at baseline for OUD and is trying to stop it to go back to school  Wife at bedside  Objective: Vital signs in last 24 hours: Temp:  [98.5 F (36.9 C)-99.3 F (37.4 C)] 98.5 F (36.9 C) (11/13 0811) Pulse Rate:  [66-87] 78 (11/13 0811) Resp:  [14-28] 14 (11/13 0811) BP: (97-132)/(49-81) 112/67 (11/13 0811) SpO2:  [89 %-99 %] 97 % (11/13 0811) Weight:  [99.8 kg] 99.8 kg (11/12 2016)    Intake/Output from previous day: No intake/output data recorded. Intake/Output this shift: Total I/O In: -  Out: 350 [Urine:350]  PE: General: WD, male who is laying in bed in NAD HEENT: head is normocephalic, atraumatic.  Sclera are noninjected.  Pupils equal and round. EOMs intact.  Ears and nose without any masses or lesions.  Mouth is pink and moist Heart: regular, rate, and rhythm.  Normal s1,s2. No obvious murmurs, gallops, or rubs noted.   Lungs: CTAB, no wheezes, rhonchi, or rales noted.  Respiratory effort nonlabored on room air Abd: soft, NT, ND  MSK: all 4 extremities are symmetrical with no cyanosis, clubbing, or edema. No obvious deformity or ecchymosis to left chest or arm Skin: warm and dry Psych: A&Ox3 with an appropriate affect.    Lab Results:  Recent Labs    02/19/23 2032 02/20/23 0535  WBC 17.3* 8.9  HGB 13.8 13.9  HCT 39.6 40.1  PLT 283 254   BMET Recent Labs    02/19/23 1928 02/19/23 1947 02/20/23 0535  NA 135 138 134*  K 4.0 3.8 3.9  CL 102 102 102  CO2 24  --  24  GLUCOSE 103* 104* 106*  BUN 19 23* 20  CREATININE 1.00 1.10 0.92  CALCIUM 9.1  --  9.0   PT/INR Recent Labs    02/19/23 1928  LABPROT  13.2  INR 1.0   CMP     Component Value Date/Time   NA 134 (L) 02/20/2023 0535   K 3.9 02/20/2023 0535   CL 102 02/20/2023 0535   CO2 24 02/20/2023 0535   GLUCOSE 106 (H) 02/20/2023 0535   BUN 20 02/20/2023 0535   CREATININE 0.92 02/20/2023 0535   CALCIUM 9.0 02/20/2023 0535   PROT 6.3 (L) 02/19/2023 1928   ALBUMIN 3.9 02/19/2023 1928   AST 45 (H) 02/19/2023 1928   ALT 38 02/19/2023 1928   ALKPHOS 60 02/19/2023 1928   BILITOT 0.6 02/19/2023 1928   GFRNONAA >60 02/20/2023 0535   GFRAA  09/07/2010 1129    >60        The eGFR has been calculated using the MDRD equation. This calculation has not been validated in all clinical situations. eGFR's persistently <60 mL/min signify possible Chronic Kidney Disease.   Lipase  No results found for: "LIPASE"     Studies/Results: DG Chest Port 1 View  Result Date: 02/20/2023 CLINICAL DATA:  Pulmonary contusion EXAM: PORTABLE CHEST 1 VIEW COMPARISON:  CT from yesterday FINDINGS: Pulmonary contusion by CT is occult versus clearing radiographically. No hemothorax or pneumothorax. Normal heart size and mediastinal contours. IMPRESSION: Negative portable chest. Pulmonary contusion is not clearly visualized. Electronically Signed   By: Audry Riles.D.  On: 02/20/2023 06:05   DG Shoulder Left Port  Result Date: 02/19/2023 CLINICAL DATA:  Status post seizure and subsequent fall from a tree. EXAM: LEFT SHOULDER COMPARISON:  None Available. FINDINGS: There is no evidence of fracture or dislocation. There is no evidence of arthropathy or other focal bone abnormality. Soft tissues are unremarkable. IMPRESSION: Negative. Electronically Signed   By: Aram Candela M.D.   On: 02/19/2023 23:30   CT HEAD WO CONTRAST  Result Date: 02/19/2023 CLINICAL DATA:  Polytrauma, blunt; Head trauma, moderate-severe pt had a 52ft fall from a tree EXAM: CT HEAD WITHOUT CONTRAST CT CERVICAL SPINE WITHOUT CONTRAST TECHNIQUE: Multidetector CT imaging of  the head and cervical spine was performed following the standard protocol without intravenous contrast. Multiplanar CT image reconstructions of the cervical spine were also generated. RADIATION DOSE REDUCTION: This exam was performed according to the departmental dose-optimization program which includes automated exposure control, adjustment of the mA and/or kV according to patient size and/or use of iterative reconstruction technique. COMPARISON:  None Available. FINDINGS: CT HEAD FINDINGS Brain: No evidence of large-territorial acute infarction. No parenchymal hemorrhage. No mass lesion. No extra-axial collection. No mass effect or midline shift. No hydrocephalus. Basilar cisterns are patent. Vascular: No hyperdense vessel. Skull: No acute fracture or focal lesion. Sinuses/Orbits: Paranasal sinuses and mastoid air cells are clear. The orbits are unremarkable. Other: None. CT CERVICAL SPINE FINDINGS Alignment: Normal. Skull base and vertebrae: No acute fracture. No aggressive appearing focal osseous lesion or focal pathologic process. Soft tissues and spinal canal: No prevertebral fluid or swelling. No visible canal hematoma. Upper chest: Unremarkable. Other: None. IMPRESSION: 1. No acute intracranial abnormality. 2. No acute displaced fracture or traumatic listhesis of the cervical spine. Electronically Signed   By: Tish Frederickson M.D.   On: 02/19/2023 20:40   CT CERVICAL SPINE WO CONTRAST  Result Date: 02/19/2023 CLINICAL DATA:  Polytrauma, blunt; Head trauma, moderate-severe pt had a 33ft fall from a tree EXAM: CT HEAD WITHOUT CONTRAST CT CERVICAL SPINE WITHOUT CONTRAST TECHNIQUE: Multidetector CT imaging of the head and cervical spine was performed following the standard protocol without intravenous contrast. Multiplanar CT image reconstructions of the cervical spine were also generated. RADIATION DOSE REDUCTION: This exam was performed according to the departmental dose-optimization program which  includes automated exposure control, adjustment of the mA and/or kV according to patient size and/or use of iterative reconstruction technique. COMPARISON:  None Available. FINDINGS: CT HEAD FINDINGS Brain: No evidence of large-territorial acute infarction. No parenchymal hemorrhage. No mass lesion. No extra-axial collection. No mass effect or midline shift. No hydrocephalus. Basilar cisterns are patent. Vascular: No hyperdense vessel. Skull: No acute fracture or focal lesion. Sinuses/Orbits: Paranasal sinuses and mastoid air cells are clear. The orbits are unremarkable. Other: None. CT CERVICAL SPINE FINDINGS Alignment: Normal. Skull base and vertebrae: No acute fracture. No aggressive appearing focal osseous lesion or focal pathologic process. Soft tissues and spinal canal: No prevertebral fluid or swelling. No visible canal hematoma. Upper chest: Unremarkable. Other: None. IMPRESSION: 1. No acute intracranial abnormality. 2. No acute displaced fracture or traumatic listhesis of the cervical spine. Electronically Signed   By: Tish Frederickson M.D.   On: 02/19/2023 20:40   DG Pelvis Portable  Result Date: 02/19/2023 CLINICAL DATA:  Trauma EXAM: PORTABLE PELVIS 1-2 VIEWS COMPARISON:  CT abdomen pelvis 02/19/2023 FINDINGS: There is no evidence of pelvic fracture or diastasis. No pelvic bone lesions are seen. IMPRESSION: Negative. Electronically Signed   By: Normajean Glasgow.D.  On: 02/19/2023 20:34   DG Chest Port 1 View  Result Date: 02/19/2023 CLINICAL DATA:  Trauma EXAM: PORTABLE CHEST 1 VIEW COMPARISON:  CT chest 02/19/2023 FINDINGS: The heart and mediastinal contours are within normal limits. Patchy airspace opacities better evaluated on CT. No pulmonary edema. No pleural effusion. No pneumothorax. No acute osseous abnormality. IMPRESSION: Patchy airspace opacities better evaluated on CT. Electronically Signed   By: Tish Frederickson M.D.   On: 02/19/2023 20:32   CT CHEST ABDOMEN PELVIS W  CONTRAST  Result Date: 02/19/2023 CLINICAL DATA:  Polytrauma, blunt  a 40ft fall from a tree EXAM: CT CHEST, ABDOMEN, AND PELVIS WITH CONTRAST TECHNIQUE: Multidetector CT imaging of the chest, abdomen and pelvis was performed following the standard protocol during bolus administration of intravenous contrast. RADIATION DOSE REDUCTION: This exam was performed according to the departmental dose-optimization program which includes automated exposure control, adjustment of the mA and/or kV according to patient size and/or use of iterative reconstruction technique. CONTRAST:  75mL ISOVUE-370 IOPAMIDOL (ISOVUE-370) INJECTION 76% COMPARISON:  None Available. FINDINGS: CHEST: Cardiovascular: No aortic injury. The thoracic aorta is normal in caliber. The heart is normal in size. No significant pericardial effusion. Mediastinum/Nodes: Likely trace residual thymus within the anterior mediastinum. No pneumomediastinum. No mediastinal hematoma. The esophagus is unremarkable. The thyroid is unremarkable. The central airways are patent. No mediastinal, hilar, or axillary lymphadenopathy. Lungs/Pleura: Peripheral ground-glass airspace opacities within the right lower, bilateral upper lobe suggestive of developing pulmonary contusions. No pulmonary nodule. No pulmonary mass. No pulmonary laceration. No pneumatocele formation. No pleural effusion. No pneumothorax. No hemothorax. Musculoskeletal/Chest wall: No chest wall mass. Left posterior back subcutaneus soft tissue edema versus developing hematoma. No acute sternal fracture. Question anterior right first costochondral junction fracture (6:63). No spinal fracture. 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 ABDOMEN / PELVIS: Hepatobiliary: Not enlarged. No focal lesion. No laceration or subcapsular hematoma. The gallbladder is otherwise unremarkable with no radio-opaque gallstones. No biliary ductal dilatation. Pancreas: Normal pancreatic contour. No main pancreatic duct  dilatation. Spleen: Not enlarged. No focal lesion. No laceration, subcapsular hematoma, or vascular injury. Adrenals/Urinary Tract: No nodularity bilaterally. Bilateral kidneys enhance symmetrically. No hydronephrosis. No contusion, laceration, or subcapsular hematoma. No injury to the vascular structures or collecting systems. No hydroureter. The urinary bladder is unremarkable. On delayed imaging, there is no urothelial wall thickening and there are no filling defects in the opacified portions of the bilateral collecting systems or ureters. Stomach/Bowel: No small or large bowel wall thickening or dilatation. The appendix is unremarkable. Vasculature/Lymphatics: No abdominal aorta or iliac aneurysm. No active contrast extravasation or pseudoaneurysm. No abdominal, pelvic, inguinal lymphadenopathy. Reproductive: Normal. Other: No simple free fluid ascites. No pneumoperitoneum. No hemoperitoneum. No mesenteric hematoma identified. No organized fluid collection. Musculoskeletal: Left hip subcutaneus soft tissue hematoma along the fascia lata with no definite Norma Fredrickson lesion identified. No acute pelvic fracture. No spinal fracture. Ports and Devices: None. IMPRESSION: 1. Question anterior right first costochondral junction fracture. Correlate with point tenderness to palpation. 2. Peripheral ground-glass airspace opacities within the right lower, bilateral upper lobe suggestive of developing pulmonary contusions. 3. Left hip subcutaneus soft tissue edema. No definite Norma Fredrickson lesion . Recommend attention on follow-up. No underlying pelvic fracture. 4. Left posterior back subcutaneus soft tissue edema versus developing hematoma. 5. No acute intra-abdominal or intrapelvic traumatic injury. 6. No acute fracture or traumatic malalignment of the thoracic or lumbar spine. Electronically Signed   By: Tish Frederickson M.D.   On: 02/19/2023 20:32  Anti-infectives: Anti-infectives (From admission, onward)     None        Assessment/Plan  32 year old male after a 20 foot fall and seizure   Seizure disorder- resume home meds Possible right 1st rib costochondral junction fracture and pulmonary contusions - pulmonary toilet, pain control, repeat chest x-ray this morning without contusion L Shoulder pain - shoulder and humerus xray neg L hip pain - CT neg   Suboxone - somewhat hyperalgesic; continue PRN pain control. Continue on dc    FEN: reg ID: none  VTE: lovenox  Dispo: worked well with PT this am and has support from wife on dc. Await OT reccs but likely home today  I reviewed last 24 h vitals and pain scores, last 48 h intake and output, last 24 h labs and trends, and last 24 h imaging results.     LOS: 0 days   Eric Form, [facility] Surgery 02/20/2023, 9:25 AM Please see Amion for pager number during day hours 7:00am-4:30pm

## 2023-02-26 NOTE — Discharge Summary (Signed)
Physician Discharge Summary  Patient ID: Edward Harper MRN: [id number] DOB/AGE: 31/10/1990 31 y.o.  Admit date: 02/19/2023 Discharge date: 02/20/2023  Admission Diagnoses Pulmonary contusion [S27.329A] Contusion of both lungs, initial encounter [S27.322A] Fall, initial encounter [W19.XXXA] Critical polytrauma [T07.XXXA]  Discharge Diagnoses Patient Active Problem List   Diagnosis Date Noted   Pulmonary contusion 02/19/2023  Seizure disorder  Consultants none  Procedures none  HPI: 32 year old man with history of seizure disorder (has not taken Keppra in about 2 days), and substance abuse currently on Suboxone who was up in a stand earlier today when he felt like he might be having a seizure, tried to make his way down and ended up falling about 20 feet.  Unknown downtime after this, he woke up crawling around the field, was found by his family who then called EMS.  He was activated as a level 2 trauma after arrival.  He reports pain in the left shoulder and chest, bruising to the right flank.   He has undergone plain films of the chest and pelvis as well as CT scans of the head, C-spine, and chest abdomen pelvis.  This is notable for a right first costochondral junction fracture as well as likely evolving right pulmonary contusions, soft tissue edema along the left hip and left posterior back.  Per Dr. Andria Meuse, bedside ultrasound with concern for pulmonary edema.  Trauma service admission requested regarding pulmonary contusions.  Hospital Course:   Patient was admitted for pain control and monitoring of his pulmonary contusions.  His respiratory status remained stable on room air and follow-up chest x-ray without findings of contusion.  He had some left shoulder and left hip pain with imaging negative for acute skeletal injuries.  He takes seizure medicine at baseline and was told to resume this on discharge.  He takes Suboxone at baseline and was told to resume this on discharge and  follow-up with prescribing provider.  He was discharged with oxycodone for pain control.  On date of discharge patient had appropriately progressed therapies and met criteria for safe discharge home with the support of his wife.  Occupational Therapy recommended outpatient OT and PT recommended rolling walker DME but he chose to leave prior to this and being completely arranged.  I discussed discharge instructions with patient as well as return precautions and all questions and concerns were addressed.   I or a member of my team have reviewed this patient in the Controlled Substance Database.  Patient agrees to follow up as below  Allergies as of 02/20/2023       Reactions   Penicillins Rash        Medication List     TAKE these medications    acetaminophen 500 MG tablet Commonly known as: TYLENOL Take 2 tablets (1,000 mg total) by mouth every 6 (six) hours as needed for mild pain (pain score 1-3).   docusate sodium 100 MG capsule Commonly known as: COLACE Take 1 capsule (100 mg total) by mouth 2 (two) times daily as needed for mild constipation.   ibuprofen 600 MG tablet Commonly known as: ADVIL Take 1 tablet (600 mg total) by mouth every 8 (eight) hours as needed for mild pain (pain score 1-3).   levETIRAcetam 1000 MG tablet Commonly known as: KEPPRA Take 1,000 mg by mouth 2 (two) times daily.   methocarbamol 500 MG tablet Commonly known as: ROBAXIN Take 1 tablet (500 mg total) by mouth every 6 (six) hours as needed for up to 7 days for  muscle spasms.   polyethylene glycol 17 g packet Commonly known as: MIRALAX / GLYCOLAX Take 17 g by mouth daily as needed for severe constipation (constipation).   Suboxone 8-2 MG Film Generic drug: Buprenorphine HCl-Naloxone HCl Place 1 strip under the tongue 3 (three) times daily.       ASK your doctor about these medications    oxyCODONE 5 MG immediate release tablet Commonly known as: Oxy IR/ROXICODONE Take 1 tablet (5  mg total) by mouth every 6 (six) hours as needed for up to 5 days for severe pain (pain score 7-10). Ask about: Should I take this medication?          Follow-up Information     [facility] Schedule an appointment as soon as possible for a visit.   Why: follow up ASAP after admission for ongoing managament of suboxone and for follow up of seizure activity Contact information: [address] [place] [address] [phone]                 Signed: Eric Form , [facility] Surgery 02/26/2023, 10:13 AM Please see Amion for pager number during day hours 7:00am-4:30pm

## 2023-03-05 ENCOUNTER — Other Ambulatory Visit: Payer: Self-pay

## 2023-03-05 ENCOUNTER — Emergency Department (HOSPITAL_COMMUNITY): Payer: Medicaid Other

## 2023-03-05 ENCOUNTER — Emergency Department (HOSPITAL_COMMUNITY)
Admission: EM | Admit: 2023-03-05 | Discharge: 2023-03-06 | Disposition: A | Discharge status: Home / Self Care | Payer: Medicaid Other | Attending: Emergency Medicine | Admitting: Emergency Medicine

## 2023-03-05 ENCOUNTER — Encounter (HOSPITAL_COMMUNITY): Payer: Self-pay

## 2023-03-05 DIAGNOSIS — F17210 Nicotine dependence, cigarettes, uncomplicated: Secondary | ICD-10-CM | POA: Insufficient documentation

## 2023-03-05 DIAGNOSIS — R0602 Shortness of breath: Secondary | ICD-10-CM | POA: Insufficient documentation

## 2023-03-05 LAB — CBC WITH DIFFERENTIAL/PLATELET
Abs Immature Granulocytes: 0.03 10*3/uL (ref 0.00–0.07)
Basophils Absolute: 0 10*3/uL (ref 0.0–0.1)
Basophils Relative: 0 %
Eosinophils Absolute: 0.4 10*3/uL (ref 0.0–0.5)
Eosinophils Relative: 4 %
HCT: 40.1 % (ref 39.0–52.0)
Hemoglobin: 14.3 g/dL (ref 13.0–17.0)
Immature Granulocytes: 0 %
Lymphocytes Relative: 16 %
Lymphs Abs: 1.5 10*3/uL (ref 0.7–4.0)
MCH: 31.2 pg (ref 26.0–34.0)
MCHC: 35.7 g/dL (ref 30.0–36.0)
MCV: 87.4 fL (ref 80.0–100.0)
Monocytes Absolute: 0.7 10*3/uL (ref 0.1–1.0)
Monocytes Relative: 7 %
Neutro Abs: 7.2 10*3/uL (ref 1.7–7.7)
Neutrophils Relative %: 73 %
Platelets: 338 10*3/uL (ref 150–400)
RBC: 4.59 MIL/uL (ref 4.22–5.81)
RDW: 12.2 % (ref 11.5–15.5)
WBC: 9.8 10*3/uL (ref 4.0–10.5)
nRBC: 0 % (ref 0.0–0.2)

## 2023-03-05 LAB — COMPREHENSIVE METABOLIC PANEL
ALT: 21 U/L (ref 0–44)
AST: 20 U/L (ref 15–41)
Albumin: 4.2 g/dL (ref 3.5–5.0)
Alkaline Phosphatase: 104 U/L (ref 38–126)
Anion gap: 3 — ABNORMAL LOW (ref 5–15)
BUN: 18 mg/dL (ref 6–20)
CO2: 28 mmol/L (ref 22–32)
Calcium: 8.7 mg/dL — ABNORMAL LOW (ref 8.9–10.3)
Chloride: 104 mmol/L (ref 98–111)
Creatinine, Ser: 0.89 mg/dL (ref 0.61–1.24)
GFR, Estimated: >60 mL/min (ref >60)
Glucose, Bld: 98 mg/dL (ref 70–99)
Potassium: 3.6 mmol/L (ref 3.5–5.1)
Sodium: 135 mmol/L (ref 135–145)
Total Bilirubin: 0.6 mg/dL (ref <1.2)
Total Protein: 7.3 g/dL (ref 6.5–8.1)

## 2023-03-05 LAB — D-DIMER, QUANTITATIVE: D-Dimer, Quant: 2.13 ug{FEU}/mL — ABNORMAL HIGH (ref 0.00–0.50)

## 2023-03-05 MED ORDER — IPRATROPIUM-ALBUTEROL 0.5-2.5 (3) MG/3ML IN SOLN
3.0000 mL | Freq: Once | RESPIRATORY_TRACT | Status: AC
  Administered 2023-03-05: 3 mL via RESPIRATORY_TRACT
  Filled 2023-03-05: qty 3

## 2023-03-05 MED ORDER — IOHEXOL 350 MG/ML SOLN
75.0000 mL | Freq: Once | INTRAVENOUS | Status: AC | PRN
  Administered 2023-03-05: 75 mL via INTRAVENOUS

## 2023-03-05 MED ORDER — LEVETIRACETAM IN NACL 1000 MG/100ML IV SOLN
1000.0000 mg | Freq: Once | INTRAVENOUS | Status: AC
  Administered 2023-03-05: 1000 mg via INTRAVENOUS
  Filled 2023-03-05: qty 100

## 2023-03-05 NOTE — ED Provider Notes (Addendum)
[facility] AT [facility] Provider Note   CSN: [id number] Arrival date & time: 03/05/23  2841     History  Chief Complaint  Patient presents with   Shortness of Breath    Edward Harper is a 32 y.o. male.  He has PMH of epilepsy on Keppra and substance use disorder on Suboxone.  Presents to ER for productive cough and shortness of breath.  He had admission to the hospital on 02/19/2023 after falling 18 feet out of a tree stand he reports.  He states he was admitted to [facility] for pulmonary contusions.  He states while in hospital he was coughing up blood and this is resolved but now he is coughing up "mouthfuls" of green sputum every morning and states he feels like he cannot get enough air when he takes of breath.  He also notes he has been out of his Keppra for 2 days. Patient reports he smokes cigarettes at baseline but has not been able to smoke for the past couple of days due to the shortness of breath.   Shortness of Breath      Home Medications Prior to Admission medications   Medication Sig Start Date End Date Taking? Authorizing Provider  acetaminophen (TYLENOL) 500 MG tablet Take 2 tablets (1,000 mg total) by mouth every 6 (six) hours as needed for mild pain (pain score 1-3). 02/20/23   Eric Form, PA-C  docusate sodium (COLACE) 100 MG capsule Take 1 capsule (100 mg total) by mouth 2 (two) times daily as needed for mild constipation. 02/20/23   Eric Form, PA-C  ibuprofen (ADVIL) 600 MG tablet Take 1 tablet (600 mg total) by mouth every 8 (eight) hours as needed for mild pain (pain score 1-3). 02/20/23   Eric Form, PA-C  levETIRAcetam (KEPPRA) 1000 MG tablet Take 1,000 mg by mouth 2 (two) times daily. 01/07/23   [provider]  polyethylene glycol (MIRALAX / GLYCOLAX) 17 g packet Take 17 g by mouth daily as needed for severe constipation (constipation). 02/20/23   Eric Form, PA-C  SUBOXONE 8-2 MG FILM  Place 1 strip under the tongue 3 (three) times daily. 02/05/23   [provider]      Allergies    Penicillins    Review of Systems   Review of Systems  Respiratory:  Positive for shortness of breath.     Physical Exam Updated Vital Signs BP 125/75 (BP Location: Right Arm)   Pulse 85   Temp 98.3 F (36.8 C) (Oral)   Resp 16   Ht 5\' 8"  (1.727 m)   Wt 45.3 kg   SpO2 98%   BMI 15.17 kg/m  Physical Exam Vitals and nursing note reviewed.  Constitutional:      General: He is not in acute distress.    Appearance: He is well-developed.  HENT:     Head: Normocephalic and atraumatic.  Eyes:     Conjunctiva/sclera: Conjunctivae normal.  Cardiovascular:     Rate and Rhythm: Normal rate and regular rhythm.     Heart sounds: No murmur heard. Pulmonary:     Effort: Pulmonary effort is normal. No respiratory distress.     Breath sounds: Examination of the right-upper field reveals wheezing. Examination of the left-upper field reveals wheezing. Examination of the right-middle field reveals wheezing. Examination of the left-middle field reveals wheezing. Examination of the right-lower field reveals wheezing. Examination of the left-lower field reveals wheezing. Wheezing present.  Abdominal:  Palpations: Abdomen is soft.     Tenderness: There is no abdominal tenderness.  Musculoskeletal:        General: No swelling.     Cervical back: Neck supple.  Skin:    General: Skin is warm and dry.     Capillary Refill: Capillary refill takes less than 2 seconds.  Neurological:     General: No focal deficit present.     Mental Status: He is alert.  Psychiatric:        Mood and Affect: Mood normal.     ED Results / Procedures / Treatments   Labs (all labs ordered are listed, but only abnormal results are displayed) Labs Reviewed  COMPREHENSIVE METABOLIC PANEL - Abnormal; Notable for the following components:      Result Value   Calcium 8.7 (*)    Anion gap 3 (*)    All  other components within normal limits  D-DIMER, QUANTITATIVE - Abnormal; Notable for the following components:   D-Dimer, Quant 2.13 (*)    All other components within normal limits  CBC WITH DIFFERENTIAL/PLATELET    EKG EKG Interpretation Date/Time:  Tuesday March 05 2023 20:22:07 EST Ventricular Rate:  81 PR Interval:  115 QRS Duration:  92 QT Interval:  373 QTC Calculation: 433 R Axis:   -22  Text Interpretation: Sinus rhythm Borderline short PR interval Borderline left axis deviation ST elevation, consider inferior injury Confirmed by Anders Simmonds [phone]) on 03/05/2023 9:05:45 PM  Radiology No results found.  Procedures Procedures    Medications Ordered in ED Medications  ipratropium-albuterol (DUONEB) 0.5-2.5 (3) MG/3ML nebulizer solution 3 mL (3 mLs Nebulization Given 03/05/23 2011)  levETIRAcetam (KEPPRA) IVPB 1000 mg/100 mL premix (0 mg Intravenous Stopped 03/05/23 2044)    ED Course/ Medical Decision Making/ A&P                                 Medical Decision Making This patient presents to the ED for concern of SOB, this involves an extensive number of treatment options, and is a complaint that carries with it a high risk of complications and morbidity.  The differential diagnosis includes pneumonia, PE, bronchitis, asthma, pneumothorax, anemia, other   Co morbidities that complicate the patient evaluation :   Substance abuse, seizures, recent pulmonary contusion   Additional history obtained:  Additional history obtained from EMR External records from outside source obtained and reviewed including prior notes   Lab Tests:  I Ordered, and personally interpreted labs.  The pertinent results include: d-dimer  2.1, CBC and CMP with no significant abnormalities   Imaging Studies ordered:  I ordered imaging studies including chest x-ray which shows no pulmonary edema or infiltrate, no pneumothorax I independently visualized and interpreted imaging  within scope of identifying emergent findings  I agree with the radiologist interpretation   Cardiac Monitoring: / EKG:  The patient was maintained on a cardiac monitor.  I personally viewed and interpreted the cardiac monitored which showed an underlying rhythm of: Sinus rhythm   Consultations Obtained:  I requested consultation with the ER doctor at [facility] Dr. Benjiman Core,  and discussed lab and imaging findings as well as pertinent plan - they recommend: Acceptance and transfer to obtain PE study   Problem List / ED Course / Critical interventions / Medication management  Shortness of breath in setting of recent trauma with pulmonary contusions, patient have a productive cough and feels short  of breath, especially at night.  Satting 98% on room air, normal heart rate.  D-dimer unfortunately is positive we do not have th ability to do CT scan at this facility today so unfortunately patient will have to be transferred to get the study done.  His wife wants to drive him, given his clinical stability I feel this is reasonable and he would likely get to the facility sooner than awaiting EMS transport.  He does not have an oxygen requirement.  They understand importance of getting the CT scan today.  I discussed with patient and his wife and his father who is also at bedside that they would be presenting to [facility], not [facility] where he was seen for the trauma.  They verbalized understanding of this.  Patient also out of his Keppra, given dose of his Keppra tonight I ordered medication including DuoNeb for wheezing and shortness of breath Reevaluation of the patient after these medicines showed that the patient improved I have reviewed the patients home medicines and have made adjustments as needed   Social Determinants of Health:  Patient smokes cigarettes       Amount and/or Complexity of Data Reviewed Labs: ordered. Radiology:  ordered.  Risk Prescription drug management.           Final Clinical Impression(s) / ED Diagnoses Final diagnoses:  None    Rx / DC Orders ED Discharge Orders     None         Ma Rings, PA-C 03/05/23 2111    Ma Rings, PA-C 03/05/23 2117    Anders Simmonds T, DO 03/09/23 1038

## 2023-03-05 NOTE — ED Notes (Signed)
Handoff report given to [facility] ED Charge Nurse

## 2023-03-05 NOTE — ED Provider Notes (Signed)
  Physical Exam  BP 136/69   Pulse 74   Temp 98.4 F (36.9 C) (Oral)   Resp 18   Ht 5\' 8"  (1.727 m)   Wt 45.3 kg   SpO2 97%   BMI 15.17 kg/m   Physical Exam  Procedures  Procedures  ED Course / MDM    Medical Decision Making Amount and/or Complexity of Data Reviewed Labs: ordered. Radiology: ordered.  Risk Prescription drug management.   This patient is seen in transfer from [facility] for CT PE study. Patient with recent fall with pulmonary contusions, presented to [facility] for productive cough and SOB. He was found to have wheezing throughout the lung fields, treated with duonebs with improvement. Unfortunately, workup at that facility revealed elevated D-dimer to 2; no access to CT imaging there prompting transfer. If reassuring will likely discharge home.   Patient administered a second DuoNeb for persistent wheezing in the emergency department.  Improved.  Albuterol inhaler provided.  No PE on CTA, will presumptively treat with antibiotics for possible CAP given patient's productive cough and worsening symptoms.  Clinical concern for emergent underlying etiology of this patient symptoms that warrant further ED workup or patient management is exceedingly low.  Jyran  voiced understanding of her medical evaluation and treatment plan. Each of their questions answered to their expressed satisfaction.  Return precautions were given.  Patient is well-appearing, stable, and was discharged in good condition.  This chart was dictated using voice recognition software, Dragon. Despite the best efforts of this provider to proofread and correct errors, errors may still occur which can change documentation meaning.    Sherrilee Gilles 03/06/23 0032    Benjiman Core, MD 03/07/23 [phone]

## 2023-03-05 NOTE — ED Notes (Signed)
Patient transported to X-ray 

## 2023-03-05 NOTE — ED Notes (Signed)
Pt IV wrapped in coban for POV travel to [facility]

## 2023-03-05 NOTE — ED Triage Notes (Signed)
Pt states he fell out of tree stand a few weeks ago, has lung contusions that is causing him to have SOB.  Reports he can't do normal activities or lay down without feeling SOB.  Reports having green sputum. Denies fever.

## 2023-03-06 MED ORDER — ALBUTEROL SULFATE HFA 108 (90 BASE) MCG/ACT IN AERS
2.0000 | INHALATION_SPRAY | Freq: Once | RESPIRATORY_TRACT | Status: AC
  Administered 2023-03-06: 2 via RESPIRATORY_TRACT
  Filled 2023-03-06: qty 6.7

## 2023-03-06 MED ORDER — DOXYCYCLINE HYCLATE 100 MG PO CAPS: 100.0000 mg | ORAL_CAPSULE | Freq: Two times a day (BID) | ORAL | 0 refills | Status: AC

## 2023-03-06 NOTE — Discharge Instructions (Addendum)
Your workup today was reassuring, there are no blood clots on your CT scan.  The pulmonary contusions that you experienced are improving.  You are being treated with antibiotics for possible developing infection.  Please follow-up with your primary care doctor.  Return to the ER with any new severe symptoms.  Please be aware that the antibiotic you have been prescribed make you more sensitive to the sun.
